# Patient Record
Sex: Male | Born: 1970 | Race: Black or African American | Hispanic: No | Marital: Single | Smoking: Current every day smoker
Health system: Southern US, Community
[De-identification: ages and names within clinical notes are randomized; demographics above are authoritative.]

## PROBLEM LIST (undated history)

## (undated) DIAGNOSIS — Z9989 Dependence on other enabling machines and devices: Secondary | ICD-10-CM

## (undated) DIAGNOSIS — G4733 Obstructive sleep apnea (adult) (pediatric): Secondary | ICD-10-CM

## (undated) DIAGNOSIS — I10 Essential (primary) hypertension: Secondary | ICD-10-CM

## (undated) HISTORY — PX: OTHER SURGICAL HISTORY: SHX169

## (undated) HISTORY — DX: Dependence on other enabling machines and devices: Z99.89

## (undated) HISTORY — DX: Obstructive sleep apnea (adult) (pediatric): G47.33

## (undated) HISTORY — DX: Essential (primary) hypertension: I10

## (undated) HISTORY — PX: TESTICLE REMOVAL: SHX68

---

## 1998-11-18 ENCOUNTER — Encounter: Admission: RE | Admit: 1998-11-18 | Discharge: 1999-02-16 | Payer: Self-pay | Admitting: Family Medicine

## 2000-02-16 ENCOUNTER — Emergency Department (HOSPITAL_COMMUNITY): Admission: EM | Admit: 2000-02-16 | Discharge: 2000-02-16 | Payer: Self-pay | Admitting: Emergency Medicine

## 2000-02-16 ENCOUNTER — Encounter: Payer: Self-pay | Admitting: Emergency Medicine

## 2004-06-27 ENCOUNTER — Emergency Department (HOSPITAL_COMMUNITY): Admission: EM | Admit: 2004-06-27 | Discharge: 2004-06-27 | Payer: Self-pay | Admitting: Emergency Medicine

## 2006-02-15 ENCOUNTER — Ambulatory Visit (HOSPITAL_COMMUNITY): Admission: RE | Admit: 2006-02-15 | Discharge: 2006-02-15 | Payer: Self-pay | Admitting: Emergency Medicine

## 2006-02-15 ENCOUNTER — Emergency Department (HOSPITAL_COMMUNITY): Admission: EM | Admit: 2006-02-15 | Discharge: 2006-02-15 | Payer: Self-pay | Admitting: Emergency Medicine

## 2006-02-15 ENCOUNTER — Encounter: Payer: Self-pay | Admitting: Vascular Surgery

## 2009-10-20 ENCOUNTER — Emergency Department (HOSPITAL_COMMUNITY): Admission: EM | Admit: 2009-10-20 | Discharge: 2009-10-21 | Payer: Self-pay | Admitting: Emergency Medicine

## 2009-10-21 ENCOUNTER — Ambulatory Visit: Payer: Self-pay | Admitting: Vascular Surgery

## 2009-10-21 ENCOUNTER — Encounter (INDEPENDENT_AMBULATORY_CARE_PROVIDER_SITE_OTHER): Payer: Self-pay | Admitting: Emergency Medicine

## 2009-10-21 ENCOUNTER — Ambulatory Visit: Admission: RE | Admit: 2009-10-21 | Discharge: 2009-10-21 | Payer: Self-pay | Admitting: Emergency Medicine

## 2013-06-19 ENCOUNTER — Ambulatory Visit (HOSPITAL_COMMUNITY)
Admission: RE | Admit: 2013-06-19 | Discharge: 2013-06-19 | Disposition: A | Discharge status: Home / Self Care | Payer: BC Managed Care – PPO | Source: Ambulatory Visit | Attending: Internal Medicine | Admitting: Internal Medicine

## 2013-06-19 ENCOUNTER — Other Ambulatory Visit (HOSPITAL_COMMUNITY): Payer: Self-pay | Admitting: Internal Medicine

## 2013-06-19 DIAGNOSIS — R0989 Other specified symptoms and signs involving the circulatory and respiratory systems: Secondary | ICD-10-CM | POA: Insufficient documentation

## 2013-06-19 DIAGNOSIS — R0602 Shortness of breath: Secondary | ICD-10-CM

## 2013-06-19 DIAGNOSIS — F17200 Nicotine dependence, unspecified, uncomplicated: Secondary | ICD-10-CM | POA: Insufficient documentation

## 2013-06-19 DIAGNOSIS — R059 Cough, unspecified: Secondary | ICD-10-CM | POA: Insufficient documentation

## 2013-06-19 DIAGNOSIS — R05 Cough: Secondary | ICD-10-CM | POA: Insufficient documentation

## 2014-02-01 ENCOUNTER — Ambulatory Visit (INDEPENDENT_AMBULATORY_CARE_PROVIDER_SITE_OTHER): Payer: BC Managed Care – PPO | Admitting: Internal Medicine

## 2014-02-01 VITALS — BP 136/88 | HR 98 | Temp 99.7°F | Resp 18 | Ht 67.0 in | Wt >= 6400 oz

## 2014-02-01 DIAGNOSIS — N390 Urinary tract infection, site not specified: Secondary | ICD-10-CM

## 2014-02-01 DIAGNOSIS — I10 Essential (primary) hypertension: Secondary | ICD-10-CM

## 2014-02-01 DIAGNOSIS — R8281 Pyuria: Secondary | ICD-10-CM

## 2014-02-01 DIAGNOSIS — R350 Frequency of micturition: Secondary | ICD-10-CM

## 2014-02-01 LAB — POCT UA - MICROSCOPIC ONLY
Bacteria, U Microscopic: NEGATIVE
Casts, Ur, LPF, POC: NEGATIVE
Crystals, Ur, HPF, POC: NEGATIVE
Epithelial cells, urine per micros: NEGATIVE
Mucus, UA: NEGATIVE
Yeast, UA: NEGATIVE

## 2014-02-01 LAB — POCT URINALYSIS DIPSTICK
Bilirubin, UA: NEGATIVE
Glucose, UA: NEGATIVE
Ketones, UA: NEGATIVE
Nitrite, UA: NEGATIVE
Protein, UA: 300
Spec Grav, UA: 1.025
Urobilinogen, UA: 2
pH, UA: 6.5

## 2014-02-01 MED ORDER — PHENAZOPYRIDINE HCL 200 MG PO TABS: 200.0000 mg | ORAL_TABLET | Freq: Three times a day (TID) | ORAL | Status: DC | PRN

## 2014-02-01 MED ORDER — CIPROFLOXACIN HCL 500 MG PO TABS: 500.0000 mg | ORAL_TABLET | Freq: Two times a day (BID) | ORAL | Status: DC

## 2014-02-01 NOTE — Progress Notes (Signed)
Subjective:    Patient ID: Cesar Parker, male    DOB: 11/21/1970, 43 y.o.   MRN: [id number]  Urinary Frequency  Associated symptoms include frequency.  Dysuria  Associated symptoms include frequency.   Chief Complaint  Patient presents with  . Urinary Frequency    x 1 day  . Dysuria   This chart was scribed for Cesar Siaobert Marshae Azam, MD by [name], ED Scribe. This patient was seen in room 12 and the patient's care was started at 1:32 PM.  HPI Comments: Cesar FifeDwayne K Luth is a 43 y.o. male with hx HTN  who presents to the [facility] complaining of possible UTI onset 1 day. Pt reports urinary frequency and dysuria 36 hrs.  Pt reports he has recently rekindled with an old sexual partner and received oral intercourse. Pt denies new sexual partner. 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 He denies penile discharge and testicle pain. Pt denies hx UTI.   Past Medical History  Diagnosis Date  . Hypertension    History reviewed. No pertinent past surgical history. Prior to Admission medications   Medication Sig Start Date End Date Taking? Authorizing Provider  hydrochlorothiazide (MICROZIDE) 12.5 MG capsule Take 12.5 mg by mouth daily.   Yes Historical Provider, MD   Review of Systems  Constitutional: Negative for fever.  Genitourinary: Positive for dysuria and frequency. Negative for discharge and testicular pain.   Objective:   Physical Exam  Nursing note and vitals reviewed. Constitutional: He is oriented to person, place, and time. He appears well-developed and well-nourished. No distress.  HENT:  Head: Normocephalic and atraumatic.  Right Ear: External ear normal.  Left Ear: External ear normal.  Mouth/Throat: Oropharynx is clear and moist.  Eyes: Conjunctivae and EOM are normal. Pupils are equal, round, and reactive to light.  Neck: Normal range of motion. Neck supple.  Cardiovascular: Normal rate, regular  rhythm and normal heart sounds.   Pulmonary/Chest: Effort normal and breath sounds normal.  Musculoskeletal: Normal range of motion.  Neurological: He is alert and oriented to person, place, and time.  Skin: Skin is warm and dry.  Psychiatric: He has a normal mood and affect. His behavior is normal.   Results for orders placed in visit on 02/01/14  POCT URINALYSIS DIPSTICK      Result Value Ref Range   Color, UA yellow     Clarity, UA clear     Glucose, UA neg     Bilirubin, UA neg     Ketones, UA neg     Spec Grav, UA 1.025     Blood, UA large     pH, UA 6.5     Protein, UA 300     Urobilinogen, UA 2.0     Nitrite, UA neg     Leukocytes, UA large (3+)    POCT UA - MICROSCOPIC ONLY      Result Value Ref Range   WBC, Ur, HPF, POC TNTC     RBC, urine, microscopic TNTC     Bacteria, U Microscopic neg     Mucus, UA neg     Epithelial cells, urine per micros neg     Crystals, Ur, HPF, POC neg     Casts, Ur, LPF, POC neg     Yeast, UA neg       Assessment & Plan:  I have completed the patient encounter in its entirety as documented by the  scribe, with editing by me where necessary. Laddie Math P. Merla Richesoolittle, M.D. Urinary frequency - Plan: POCT urinalysis dipstick, POCT UA - Microscopic Only  Pyuria - Plan: Urine culture, GC/Chlamydia Probe Amp   Meds ordered this encounter  Medications  . ciprofloxacin (CIPRO) 500 MG tablet    Sig: Take 1 tablet (500 mg total) by mouth 2 (two) times daily.    Dispense:  20 tablet    Refill:  0  . phenazopyridine (PYRIDIUM) 200 MG tablet    Sig: Take 1 tablet (200 mg total) by mouth 3 (three) times daily as needed for pain.    Dispense:  10 tablet    Refill:  0  Call w/ plan

## 2014-02-04 LAB — GC/CHLAMYDIA PROBE AMP
CT PROBE, AMP APTIMA: NEGATIVE
GC PROBE AMP APTIMA: NEGATIVE

## 2014-02-04 LAB — URINE CULTURE: Colony Count: >=100000

## 2015-02-02 ENCOUNTER — Encounter (HOSPITAL_COMMUNITY): Payer: Self-pay | Admitting: Emergency Medicine

## 2015-02-02 ENCOUNTER — Emergency Department (HOSPITAL_COMMUNITY)
Admission: EM | Admit: 2015-02-02 | Discharge: 2015-02-02 | Disposition: A | Discharge status: Home / Self Care | Payer: Self-pay | Attending: Emergency Medicine | Admitting: Emergency Medicine

## 2015-02-02 DIAGNOSIS — I10 Essential (primary) hypertension: Secondary | ICD-10-CM | POA: Insufficient documentation

## 2015-02-02 DIAGNOSIS — L03116 Cellulitis of left lower limb: Secondary | ICD-10-CM | POA: Insufficient documentation

## 2015-02-02 DIAGNOSIS — Z79899 Other long term (current) drug therapy: Secondary | ICD-10-CM | POA: Insufficient documentation

## 2015-02-02 LAB — I-STAT CHEM 8, ED
BUN: 11 mg/dL (ref 6–20)
CALCIUM ION: 1.17 mmol/L (ref 1.12–1.23)
Chloride: 98 mmol/L — ABNORMAL LOW (ref 101–111)
Creatinine, Ser: 0.9 mg/dL (ref 0.61–1.24)
GLUCOSE: 138 mg/dL — AB (ref 65–99)
HCT: 44 % (ref 39.0–52.0)
Hemoglobin: 15 g/dL (ref 13.0–17.0)
Potassium: 3.4 mmol/L — ABNORMAL LOW (ref 3.5–5.1)
Sodium: 140 mmol/L (ref 135–145)
TCO2: 29 mmol/L (ref 0–100)

## 2015-02-02 MED ORDER — POTASSIUM CHLORIDE CRYS ER 20 MEQ PO TBCR
40.0000 meq | EXTENDED_RELEASE_TABLET | Freq: Once | ORAL | Status: AC
  Administered 2015-02-02: 40 meq via ORAL
  Filled 2015-02-02: qty 2

## 2015-02-02 MED ORDER — ACETAMINOPHEN 500 MG PO TABS
1000.0000 mg | ORAL_TABLET | Freq: Once | ORAL | Status: AC
  Administered 2015-02-02: 1000 mg via ORAL
  Filled 2015-02-02: qty 2

## 2015-02-02 MED ORDER — CEPHALEXIN 500 MG PO CAPS: 500.0000 mg | ORAL_CAPSULE | Freq: Four times a day (QID) | ORAL | Status: DC

## 2015-02-02 MED ORDER — CEPHALEXIN 500 MG PO CAPS
500.0000 mg | ORAL_CAPSULE | Freq: Once | ORAL | Status: AC
Start: 2015-02-02 — End: 2015-02-02
  Administered 2015-02-02: 500 mg via ORAL
  Filled 2015-02-02: qty 1

## 2015-02-02 NOTE — Progress Notes (Signed)
CM spoke with pt who confirms uninsured [place] resident with no pcp.  CM discussed and provided written information for uninsured accepting pcps, discussed the importance of pcp vs EDP services for f/u care, www.needymeds.org, www.goodrx.com, discounted pharmacies and other [place] resources such as [facility] , [facility], affordable care act, financial assistance, uninsured dental services, [place] [facility], DSS and  health department  Reviewed resources for [place] uninsured accepting pcps like [facility], [facility] [address], [facility], [facility], local urgent care centers, [facility], [facility], [facility], [facility], [facility] plus others, medication resources, [facility] out patient pharmacies and housing Pt voiced understanding and appreciation of resources provided   Provided [facility] contact information Pt agreed to a referral Cm completed referral Pt to be contact by [facility] clinical liason  

## 2015-02-02 NOTE — ED Provider Notes (Signed)
CSN: [id number]     Arrival date & time 02/02/15  1143 History   First MD Initiated Contact with Patient 02/02/15 1259     Chief Complaint  Patient presents with  . Leg Pain     (Consider location/radiation/quality/duration/timing/severity/associated sxs/prior Treatment) HPI Complains of pain at left lower extremity onset 2 nights ago here patient reports 2 nights ago he had chills and subjective fever which have since resolved. He treated himself with ibuprofen this morning. With partial relief. No other associated symptoms. No nausea or vomiting. Pain is worse with weightbearing improved with elevating his leg. Past Medical History  Diagnosis Date  . Hypertension    History reviewed. No pertinent past surgical history. No family history on file. Social History  Substance Use Topics  . Smoking status: Never Smoker   . Smokeless tobacco: None  . Alcohol Use: None    Review of Systems  Musculoskeletal: Positive for myalgias.       Left leg and foot pain      Allergies  Review of patient's allergies indicates no known allergies.  Home Medications   Prior to Admission medications   Medication Sig Start Date End Date Taking? Authorizing Provider  amLODipine (NORVASC) 10 MG tablet Take 10 mg by mouth daily.   Yes Historical Provider, MD  hydrochlorothiazide (MICROZIDE) 12.5 MG capsule Take 12.5 mg by mouth daily.   Yes Historical Provider, MD  ibuprofen (ADVIL,MOTRIN) 200 MG tablet Take 400 mg by mouth every 6 (six) hours as needed for moderate pain.   Yes Historical Provider, MD  lisinopril (PRINIVIL,ZESTRIL) 10 MG tablet Take 10 mg by mouth daily.   Yes Historical Provider, MD  Multiple Vitamin (MULTIVITAMIN WITH MINERALS) TABS tablet Take 1 tablet by mouth daily.   Yes Historical Provider, MD  ciprofloxacin (CIPRO) 500 MG tablet Take 1 tablet (500 mg total) by mouth 2 (two) times daily. Patient not taking: Reported on 02/02/2015 02/01/14   Tonye Pearson, MD   phenazopyridine (PYRIDIUM) 200 MG tablet Take 1 tablet (200 mg total) by mouth 3 (three) times daily as needed for pain. Patient not taking: Reported on 02/02/2015 02/01/14   Tonye Pearson, MD   BP 142/82 mmHg  Pulse 74  Temp(Src) 97.8 F (36.6 C) (Oral)  Resp 18  SpO2 100% Physical Exam  Constitutional: He appears well-developed and well-nourished.  HENT:  Head: Normocephalic and atraumatic.  Eyes: Conjunctivae are normal. Pupils are equal, round, and reactive to light.  Neck: Neck supple. No tracheal deviation present. No thyromegaly present.  Cardiovascular: Normal rate and regular rhythm.   No murmur heard. Pulmonary/Chest: Effort normal and breath sounds normal.  Abdominal: Soft. Bowel sounds are normal. He exhibits no distension. There is no tenderness.  Morbidly obese  Musculoskeletal: Normal range of motion. He exhibits no edema or tenderness.  Left lower extremity reddened mildly swollen tender at proximal aspect of dorsal foot and distal one third of shin and anterolateral lower leg. No red streaks of leg note. No pain or tenderness posteriorly. No inguinal nodes.  Neurological: He is alert. Coordination normal.  Skin: Skin is warm and dry. No rash noted.  Psychiatric: He has a normal mood and affect.  Nursing note and vitals reviewed.   ED Course  Procedures (including critical care time) Labs Review Labs Reviewed - No data to display  Imaging Review No results found. I have personally reviewed and evaluated these images and lab results as part of my medical decision-making.   EKG Interpretation None  3:25 PM pain improved after treatment with Tylenol patient feels ready to go home. Results for orders placed or performed during the hospital encounter of 02/02/15  I-stat chem 8, ed  Result Value Ref Range   Sodium 140 135 - 145 mmol/L   Potassium 3.4 (L) 3.5 - 5.1 mmol/L   Chloride 98 (L) 101 - 111 mmol/L   BUN 11 6 - 20 mg/dL   Creatinine, Ser  8.29 0.61 - 1.24 mg/dL   Glucose, Bld 562 (H) 65 - 99 mg/dL   Calcium, Ion 1.30 8.65 - 1.23 mmol/L   TCO2 29 0 - 100 mmol/L   Hemoglobin 15.0 13.0 - 17.0 g/dL   HCT 78.4 69.6 - 29.5 %   No results found.  MDM  Exam and history consistent with cellulitis. Distribution of redness is not consistent with phlebitis  Plan prescription Keflex. Follow-up with Dr.Haasann within one week for reexam. Suggest hemoglobin A1c. Final diagnoses:  None   diagnosis #1 cellulitis of left lower extremity #2 hyperglycemia #3 hypokalemia      Doug Sou, MD 02/02/15 [phone]

## 2015-02-02 NOTE — Discharge Instructions (Signed)
Cellulitis Take Tylenol as directed for pain. Call Dr.Hassan's office tomorrow to arrange to get your leg recheck within 1 week. Your blood sugar today was elevated at 138. You may be diabetic. Ask Dr.Hassan to check you for diabetes with a test known as hemoglobin A1c. Return for fever, vomiting, or if you feel worse for any reason Cellulitis is an infection of the skin and the tissue under the skin. The infected area is usually red and tender. This happens most often in the arms and lower legs. HOME CARE   Take your antibiotic medicine as told. Finish the medicine even if you start to feel better.  Keep the infected arm or leg raised (elevated).  Put a warm cloth on the area up to 4 times per day.  Only take medicines as told by your doctor.  Keep all doctor visits as told. GET HELP IF:  You see red streaks on the skin coming from the infected area.  Your red area gets bigger or turns a dark color.  Your bone or joint under the infected area is painful after the skin heals.  Your infection comes back in the same area or different area.  You have a puffy (swollen) bump in the infected area.  You have new symptoms.  You have a fever. GET HELP RIGHT AWAY IF:   You feel very sleepy.  You throw up (vomit) or have watery poop (diarrhea).  You feel sick and have muscle aches and pains.   This information is not intended to replace advice given to you by your health care provider. Make sure you discuss any questions you have with your health care provider.   Document Released: 09/28/2007 Document Revised: 12/31/2014 Document Reviewed: 06/27/2011 Elsevier Interactive Patient Education [employer]

## 2015-02-02 NOTE — ED Notes (Signed)
Per pt, state left lower leg pain without injury which started on Saturday-swollen and warm to touch

## 2015-03-20 ENCOUNTER — Ambulatory Visit (INDEPENDENT_AMBULATORY_CARE_PROVIDER_SITE_OTHER): Payer: 59 | Admitting: Family Medicine

## 2015-03-20 ENCOUNTER — Encounter: Payer: Self-pay | Admitting: Family Medicine

## 2015-03-20 VITALS — BP 128/86 | HR 81 | Temp 98.1°F | Resp 16 | Ht 67.0 in | Wt >= 6400 oz

## 2015-03-20 DIAGNOSIS — Z131 Encounter for screening for diabetes mellitus: Secondary | ICD-10-CM

## 2015-03-20 DIAGNOSIS — F524 Premature ejaculation: Secondary | ICD-10-CM

## 2015-03-20 DIAGNOSIS — Z13220 Encounter for screening for lipoid disorders: Secondary | ICD-10-CM | POA: Diagnosis not present

## 2015-03-20 DIAGNOSIS — G4733 Obstructive sleep apnea (adult) (pediatric): Secondary | ICD-10-CM | POA: Diagnosis not present

## 2015-03-20 DIAGNOSIS — E049 Nontoxic goiter, unspecified: Secondary | ICD-10-CM | POA: Diagnosis not present

## 2015-03-20 DIAGNOSIS — D6489 Other specified anemias: Secondary | ICD-10-CM | POA: Diagnosis not present

## 2015-03-20 DIAGNOSIS — I10 Essential (primary) hypertension: Secondary | ICD-10-CM | POA: Diagnosis not present

## 2015-03-20 DIAGNOSIS — Z9989 Dependence on other enabling machines and devices: Secondary | ICD-10-CM

## 2015-03-20 DIAGNOSIS — E010 Iodine-deficiency related diffuse (endemic) goiter: Secondary | ICD-10-CM

## 2015-03-20 LAB — POCT URINALYSIS DIP (MANUAL ENTRY)
BILIRUBIN UA: NEGATIVE
GLUCOSE UA: NEGATIVE
Ketones, POC UA: NEGATIVE
LEUKOCYTES UA: NEGATIVE
NITRITE UA: NEGATIVE
PH UA: 5.5
Protein Ur, POC: NEGATIVE
RBC UA: NEGATIVE
Spec Grav, UA: 1.02
UROBILINOGEN UA: 0.2

## 2015-03-20 MED ORDER — HYDROCHLOROTHIAZIDE 12.5 MG PO TABS: 12.5000 mg | ORAL_TABLET | Freq: Every day | ORAL | Status: DC

## 2015-03-20 MED ORDER — LISINOPRIL 10 MG PO TABS: 10.0000 mg | ORAL_TABLET | Freq: Every day | ORAL | Status: DC

## 2015-03-20 MED ORDER — AMLODIPINE BESYLATE 10 MG PO TABS: 10.0000 mg | ORAL_TABLET | Freq: Every day | ORAL | Status: DC

## 2015-03-20 MED ORDER — CITALOPRAM HYDROBROMIDE 20 MG PO TABS: 20.0000 mg | ORAL_TABLET | Freq: Every day | ORAL | Status: DC

## 2015-03-20 NOTE — Progress Notes (Signed)
Subjective:    Patient ID: Cesar Parker, male    DOB: 18-Nov-1970, 44 y.o.   MRN: [id number]  03/20/2015  establish care; needs form filled out; and low sperm count   HPI This 44 y.o. male presents to establish care.  Last physical: 2015 Dr. Roseanne RenoHassan; 05-28-13. TDAP:  UTD; less than ten years ago. Influenza:  refuses Eye exam:  No formal eye exam; +recent employment; no glasses or contacts Dental exam:  Not established anywhere; needs tooth extraction.  Must go to oral surgeon.  HTN: Patient reports good compliance with medication, good tolerance to medication, and good symptom control.  Dizziness does occur intermittently; can occur sporadically; occurs once per month; duration 2 minutes.  Does not check BP.  Was followed by Dr. Roseanne RenoHassan once per year.  OSA with CPAP: diagnosed last year.  Wears CPAP every night.  Wearing atleast 4 hours per night.    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  Girlfriend did get pregnant once and miscarried; s/p fertility evaluation; low sperm count; started on medication that will help increase sperm count; Clomid.  Ran out of Clomid.  Requesting more Clomid.  +premature ejaculation; chronic issue for patient.     Review of Systems  Constitutional: Negative for fever, chills, diaphoresis, activity change, appetite change and fatigue.  Eyes: Negative for visual disturbance.  Respiratory: Negative for cough and shortness of breath.   Cardiovascular: Negative for chest pain, palpitations and leg swelling.  Endocrine: Negative for cold intolerance, heat intolerance, polydipsia, polyphagia and polyuria.  Neurological: Negative for dizziness, tremors, seizures, syncope, facial asymmetry, speech difficulty, weakness, light-headedness, numbness and headaches.  Psychiatric/Behavioral: Negative for suicidal ideas, sleep disturbance, self-injury and dysphoric mood. The patient is not nervous/anxious.     Past Medical History  Diagnosis Date  . Hypertension      onset 2012  . OSA on CPAP    Past Surgical History  Procedure Laterality Date  . Calcaneal spur      Resection. B.  . Testicle removal Right    No Known Allergies  Social History   Social History  . Marital Status: Single    Spouse Name: N/A  . Number of Children: N/A  . Years of Education: N/A   Occupational History  . Not on file.   Social History Main Topics  . Smoking status: Current Every Day Smoker  . Smokeless tobacco: Not on file  . Alcohol Use: Not on file  . Drug Use: Not on file  . Sexual Activity: Yes   Other Topics Concern  . Not on file   Social History Narrative   Marital status: single; dating seriously x 6 years      Children: none; does foster children intermittently      Lives: with girlfriend      Employment: truck driver x 1 years; drives [place]     Tobacco: 1/2 ppd x 20 years      Alcohol:  Socially; rare; last drink 2014      Drugs: none      Exercise: work is physical   Family History  Problem Relation Age of Onset  . Drug abuse Sister   . Mental illness Sister        Objective:    BP 128/86 mmHg  Pulse 81  Temp(Src) 98.1 F (36.7 C) (Oral)  Resp 16  Ht 5\' 7"  (1.702 m)  Wt 410 lb (185.975 kg)  BMI 64.20 kg/m2 Physical Exam  Constitutional: He is oriented  to person, place, and time. He appears well-developed and well-nourished. No distress.  Morbidly obese.  HENT:  Head: Normocephalic and atraumatic.  Right Ear: External ear normal.  Left Ear: External ear normal.  Nose: Nose normal.  Mouth/Throat: Oropharynx is clear and moist.  Eyes: Conjunctivae and EOM are normal. Pupils are equal, round, and reactive to light.  Neck: Normal range of motion. Neck supple. Carotid bruit is not present. Thyromegaly present.  Cardiovascular: Normal rate, regular rhythm, normal heart sounds and intact distal pulses.  Exam reveals no gallop and no friction rub.   No murmur heard. Pulmonary/Chest: Effort normal and breath sounds  normal. He has no wheezes. He has no rales.  Abdominal: Soft. Bowel sounds are normal. He exhibits no distension and no mass. There is no tenderness. There is no rebound and no guarding.  Lymphadenopathy:    He has no cervical adenopathy.  Neurological: He is alert and oriented to person, place, and time. No cranial nerve deficit.  Skin: Skin is warm and dry. No rash noted. He is not diaphoretic.  Psychiatric: He has a normal mood and affect. His behavior is normal.  Nursing note and vitals reviewed.       Assessment & Plan:   1. Essential hypertension   2. Morbid obesity due to excess calories (HCC)   3. Goiter   4. Screening for diabetes mellitus   5. Screening, lipid   6. OSA on CPAP   7. Premature ejaculation   8. Thyromegaly   9. Anemia due to other cause     1. HTN: controlled; obtain labs; refills provided. 2. Morbid obesity: recommend weight loss, exercise, low-caloric food choices.  RTC 3 months. 3.  Goiter: New.  Refer for thyroid US; obtain labs. 4.  Screening diabetes: obtain glucose, HgbA1c. 5. Screening lipid: obtain FLP. 6.  OSA on CPAP: stable; compliance with nightly CPAP. 7.  Premature ejaculation: chronic/uncontrolled; rx for Citalopram  qhs provided. 8.  Anemia: New; obtain labs.  Repeat at next visit.  Will need to complete stool cards at next visit.    Orders Placed This Encounter  Procedures  . US Soft Tissue Head/Neck    409 lbs/no needs/Ins uhc/aw & pt w/epic order    Standing Status: Future     Number of Occurrences: 1     Standing Expiration Date: 05/19/2016    Order Specific Question:  Reason for Exam (SYMPTOM  OR DIAGNOSIS REQUIRED)    Answer:  B thyroid enlargement/goiter    Order Specific Question:  Preferred imaging location?    Answer:  [facility]  . CBC with Differential/Platelet  . Comprehensive metabolic panel    Order Specific Question:  Has the patient fasted?    Answer:  Yes  . Lipid panel    Order Specific  Question:  Has the patient fasted?    Answer:  Yes  . TSH  . Hemoglobin A1c  . T4, free  . CBC with Differential/Platelet    Standing Status: Future     Number of Occurrences:      Standing Expiration Date: 04/13/2016  . Iron    Standing Status: Future     Number of Occurrences:      Standing Expiration Date: 04/13/2016  . IBC panel    Standing Status: Future     Number of Occurrences:      Standing Expiration Date: 04/13/2016  . Vitamin B12    Standing Status: Future     Number of Occurrences:  Standing Expiration Date: 04/13/2016  . POCT urinalysis dipstick  . EKG 12-Lead   Meds ordered this encounter  Medications  . lisinopril (PRINIVIL,ZESTRIL) 10 MG tablet    Sig: Take 1 tablet (10 mg total) by mouth daily.    Dispense:  90 tablet    Refill:  3  . amLODipine (NORVASC) 10 MG tablet    Sig: Take 1 tablet (10 mg total) by mouth daily.    Dispense:  90 tablet    Refill:  3  . hydrochlorothiazide (HYDRODIURIL) 12.5 MG tablet    Sig: Take 1 tablet (12.5 mg total) by mouth daily.    Dispense:  90 tablet    Refill:  1  . citalopram (CELEXA) 20 MG tablet    Sig: Take 1 tablet (20 mg total) by mouth daily.    Dispense:  90 tablet    Refill:  1    Return in about 6 months (around 09/17/2015) for complete physical examiniation.    Aahan Marques Paulita Fujita, M.D. [facility] [address] [place], [place]  [address] [phone] phone [phone] fax

## 2015-03-20 NOTE — Patient Instructions (Signed)
DASH Eating Plan  DASH stands for "Dietary Approaches to Stop Hypertension." The DASH eating plan is a healthy eating plan that has been shown to reduce high blood pressure (hypertension). Additional health benefits may include reducing the risk of type 2 diabetes mellitus, heart disease, and stroke. The DASH eating plan may also help with weight loss.  WHAT DO I NEED TO KNOW ABOUT THE DASH EATING PLAN?  For the DASH eating plan, you will follow these general guidelines:  · Choose foods with a percent daily value for sodium of less than 5% (as listed on the food label).  · Use salt-free seasonings or herbs instead of table salt or sea salt.  · Check with your health care provider or pharmacist before using salt substitutes.  · Eat lower-sodium products, often labeled as "lower sodium" or "no salt added."  · Eat fresh foods.  · Eat more vegetables, fruits, and low-fat dairy products.  · Choose whole grains. Look for the word "whole" as the first word in the ingredient list.  · Choose fish and skinless chicken or turkey more often than red meat. Limit fish, poultry, and meat to 6 oz (170 g) each day.  · Limit sweets, desserts, sugars, and sugary drinks.  · Choose heart-healthy fats.  · Limit cheese to 1 oz (28 g) per day.  · Eat more home-cooked food and less restaurant, buffet, and fast food.  · Limit fried foods.  · Cook foods using methods other than frying.  · Limit canned vegetables. If you do use them, rinse them well to decrease the sodium.  · When eating at a restaurant, ask that your food be prepared with less salt, or no salt if possible.  WHAT FOODS CAN I EAT?  Seek help from a dietitian for individual calorie needs.  Grains  Whole grain or whole wheat bread. Brown rice. Whole grain or whole wheat pasta. Quinoa, bulgur, and whole grain cereals. Low-sodium cereals. Corn or whole wheat flour tortillas. Whole grain cornbread. Whole grain crackers. Low-sodium crackers.  Vegetables  Fresh or frozen vegetables  (raw, steamed, roasted, or grilled). Low-sodium or reduced-sodium tomato and vegetable juices. Low-sodium or reduced-sodium tomato sauce and paste. Low-sodium or reduced-sodium canned vegetables.   Fruits  All fresh, canned (in natural juice), or frozen fruits.  Meat and Other Protein Products  Ground beef (85% or leaner), grass-fed beef, or beef trimmed of fat. Skinless chicken or turkey. Ground chicken or turkey. Pork trimmed of fat. All fish and seafood. Eggs. Dried beans, peas, or lentils. Unsalted nuts and seeds. Unsalted canned beans.  Dairy  Low-fat dairy products, such as skim or 1% milk, 2% or reduced-fat cheeses, low-fat ricotta or cottage cheese, or plain low-fat yogurt. Low-sodium or reduced-sodium cheeses.  Fats and Oils  Tub margarines without trans fats. Light or reduced-fat mayonnaise and salad dressings (reduced sodium). Avocado. Safflower, olive, or canola oils. Natural peanut or almond butter.  Other  Unsalted popcorn and pretzels.  The items listed above may not be a complete list of recommended foods or beverages. Contact your dietitian for more options.  WHAT FOODS ARE NOT RECOMMENDED?  Grains  White bread. White pasta. White rice. Refined cornbread. Bagels and croissants. Crackers that contain trans fat.  Vegetables  Creamed or fried vegetables. Vegetables in a cheese sauce. Regular canned vegetables. Regular canned tomato sauce and paste. Regular tomato and vegetable juices.  Fruits  Dried fruits. Canned fruit in light or heavy syrup. Fruit juice.  Meat and Other Protein   Products  Fatty cuts of meat. Ribs, chicken wings, bacon, sausage, bologna, salami, chitterlings, fatback, hot dogs, bratwurst, and packaged luncheon meats. Salted nuts and seeds. Canned beans with salt.  Dairy  Whole or 2% milk, cream, half-and-half, and cream cheese. Whole-fat or sweetened yogurt. Full-fat cheeses or blue cheese. Nondairy creamers and whipped toppings. Processed cheese, cheese spreads, or cheese  curds.  Condiments  Onion and garlic salt, seasoned salt, table salt, and sea salt. Canned and packaged gravies. Worcestershire sauce. Tartar sauce. Barbecue sauce. Teriyaki sauce. Soy sauce, including reduced sodium. Steak sauce. Fish sauce. Oyster sauce. Cocktail sauce. Horseradish. Ketchup and mustard. Meat flavorings and tenderizers. Bouillon cubes. Hot sauce. Tabasco sauce. Marinades. Taco seasonings. Relishes.  Fats and Oils  Butter, stick margarine, lard, shortening, ghee, and bacon fat. Coconut, palm kernel, or palm oils. Regular salad dressings.  Other  Pickles and olives. Salted popcorn and pretzels.  The items listed above may not be a complete list of foods and beverages to avoid. Contact your dietitian for more information.  WHERE CAN I FIND MORE INFORMATION?  National Heart, Lung, and [employer] Institute: www.nhlbi.nih.gov/health/health-topics/topics/dash/     This information is not intended to replace advice given to you by your health care provider. Make sure you discuss any questions you have with your health care provider.     Document Released: 03/31/2011 Document Revised: 05/02/2014 Document Reviewed: 02/13/2013  Elsevier Interactive Patient Education ©2016 [employer]

## 2015-03-21 LAB — CBC WITH DIFFERENTIAL/PLATELET
BASOS ABS: 0 10*3/uL (ref 0.0–0.1)
BASOS PCT: 0 % (ref 0–1)
EOS ABS: 0.2 10*3/uL (ref 0.0–0.7)
EOS PCT: 2 % (ref 0–5)
HCT: 38.7 % — ABNORMAL LOW (ref 39.0–52.0)
Hemoglobin: 12.2 g/dL — ABNORMAL LOW (ref 13.0–17.0)
Lymphocytes Relative: 23 % (ref 12–46)
Lymphs Abs: 2 10*3/uL (ref 0.7–4.0)
MCH: 25.8 pg — ABNORMAL LOW (ref 26.0–34.0)
MCHC: 31.5 g/dL (ref 30.0–36.0)
MCV: 81.8 fL (ref 78.0–100.0)
MONO ABS: 0.6 10*3/uL (ref 0.1–1.0)
MPV: 10.8 fL (ref 8.6–12.4)
Monocytes Relative: 7 % (ref 3–12)
Neutro Abs: 6.1 10*3/uL (ref 1.7–7.7)
Neutrophils Relative %: 68 % (ref 43–77)
PLATELETS: 249 10*3/uL (ref 150–400)
RBC: 4.73 MIL/uL (ref 4.22–5.81)
RDW: 14.4 % (ref 11.5–15.5)
WBC: 8.9 10*3/uL (ref 4.0–10.5)

## 2015-03-21 LAB — COMPREHENSIVE METABOLIC PANEL
ALK PHOS: 73 U/L (ref 40–115)
ALT: 19 U/L (ref 9–46)
AST: 19 U/L (ref 10–40)
Albumin: 3.6 g/dL (ref 3.6–5.1)
BILIRUBIN TOTAL: 0.5 mg/dL (ref 0.2–1.2)
BUN: 12 mg/dL (ref 7–25)
CO2: 29 mmol/L (ref 20–31)
Calcium: 8.9 mg/dL (ref 8.6–10.3)
Chloride: 100 mmol/L (ref 98–110)
Creat: 0.82 mg/dL (ref 0.60–1.35)
GLUCOSE: 84 mg/dL (ref 65–99)
POTASSIUM: 3.9 mmol/L (ref 3.5–5.3)
Sodium: 138 mmol/L (ref 135–146)
Total Protein: 7.4 g/dL (ref 6.1–8.1)

## 2015-03-21 LAB — TSH: TSH: 1.182 u[IU]/mL (ref 0.350–4.500)

## 2015-03-21 LAB — LIPID PANEL
CHOL/HDL RATIO: 3.2 ratio (ref <=5.0)
Cholesterol: 174 mg/dL (ref 125–200)
HDL: 55 mg/dL (ref >=40)
LDL Cholesterol: 101 mg/dL (ref <130)
Triglycerides: 92 mg/dL (ref <150)
VLDL: 18 mg/dL (ref <30)

## 2015-03-21 LAB — T4, FREE: Free T4: 1.04 ng/dL (ref 0.80–1.80)

## 2015-03-21 LAB — HEMOGLOBIN A1C
HEMOGLOBIN A1C: 4.9 % (ref <5.7)
Mean Plasma Glucose: 94 mg/dL (ref <117)

## 2015-04-14 ENCOUNTER — Encounter: Payer: Self-pay | Admitting: Family Medicine

## 2015-04-15 ENCOUNTER — Telehealth: Payer: Self-pay

## 2015-04-15 NOTE — Telephone Encounter (Signed)
Pt was given his lab results today but still has more questions and would like a call back

## 2015-04-16 NOTE — Telephone Encounter (Signed)
Spoke with pt and answered questions about his labs.

## 2015-04-17 ENCOUNTER — Ambulatory Visit
Admission: RE | Admit: 2015-04-17 | Discharge: 2015-04-17 | Disposition: A | Discharge status: Home / Self Care | Payer: 59 | Source: Ambulatory Visit | Attending: Family Medicine | Admitting: Family Medicine

## 2015-04-17 ENCOUNTER — Other Ambulatory Visit: Payer: Self-pay

## 2015-04-17 DIAGNOSIS — E049 Nontoxic goiter, unspecified: Secondary | ICD-10-CM

## 2015-05-05 ENCOUNTER — Other Ambulatory Visit: Payer: Self-pay | Admitting: Family Medicine

## 2015-05-05 DIAGNOSIS — E042 Nontoxic multinodular goiter: Secondary | ICD-10-CM

## 2015-05-08 ENCOUNTER — Ambulatory Visit: Payer: 59 | Admitting: Family Medicine

## 2015-05-29 ENCOUNTER — Ambulatory Visit: Payer: Self-pay | Admitting: Family Medicine

## 2015-06-02 ENCOUNTER — Ambulatory Visit: Payer: Self-pay | Admitting: Family Medicine

## 2015-09-25 ENCOUNTER — Encounter: Payer: 59 | Admitting: Family Medicine

## 2015-10-20 ENCOUNTER — Encounter: Payer: Self-pay | Admitting: Family Medicine

## 2015-11-16 ENCOUNTER — Telehealth: Payer: Self-pay

## 2015-11-16 DIAGNOSIS — I10 Essential (primary) hypertension: Secondary | ICD-10-CM

## 2015-11-16 NOTE — Telephone Encounter (Signed)
Patient stated his pharmacy sent over a refill request on Friday and no response. Patient need a refill of Celexa 20 MG and Hydroiudril 12.5 MG. [facility] [place] [phone].

## 2015-11-17 NOTE — Telephone Encounter (Signed)
Spoke with pt, he is planning to come in 2 weeks to see Dr. 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 Can we send in refills for pt?

## 2015-11-17 NOTE — Telephone Encounter (Signed)
Called and LM to call back with details for follow up?

## 2015-11-18 MED ORDER — CITALOPRAM HYDROBROMIDE 20 MG PO TABS: 20.0000 mg | ORAL_TABLET | Freq: Every day | ORAL | 0 refills | Status: DC

## 2015-11-18 NOTE — Telephone Encounter (Signed)
I have approved a 30 days supply of Citalopram for patient.  Please call and advise of this 30 day supply. Please advise patient how to schedule a same day appointment with me.  A Saturday appointment is likely his best option.  Please let him know of my next Saturday schedule.

## 2015-11-19 MED ORDER — HYDROCHLOROTHIAZIDE 12.5 MG PO TABS: 12.5000 mg | ORAL_TABLET | Freq: Every day | ORAL | 0 refills | Status: DC

## 2015-11-19 NOTE — Telephone Encounter (Signed)
Called pt and gave him Dr Michaelle Copas message and sch. He also asked for RF of HCTZ. Done.

## 2015-12-05 ENCOUNTER — Ambulatory Visit (INDEPENDENT_AMBULATORY_CARE_PROVIDER_SITE_OTHER): Payer: 59 | Admitting: Family Medicine

## 2015-12-05 VITALS — BP 112/71 | Temp 98.5°F | Resp 20 | Ht 67.0 in | Wt >= 6400 oz

## 2015-12-05 DIAGNOSIS — E042 Nontoxic multinodular goiter: Secondary | ICD-10-CM

## 2015-12-05 DIAGNOSIS — Z114 Encounter for screening for human immunodeficiency virus [HIV]: Secondary | ICD-10-CM

## 2015-12-05 DIAGNOSIS — D649 Anemia, unspecified: Secondary | ICD-10-CM | POA: Diagnosis not present

## 2015-12-05 DIAGNOSIS — R868 Other abnormal findings in specimens from male genital organs: Secondary | ICD-10-CM

## 2015-12-05 DIAGNOSIS — E041 Nontoxic single thyroid nodule: Secondary | ICD-10-CM | POA: Diagnosis not present

## 2015-12-05 DIAGNOSIS — I10 Essential (primary) hypertension: Secondary | ICD-10-CM | POA: Diagnosis not present

## 2015-12-05 DIAGNOSIS — S39012A Strain of muscle, fascia and tendon of lower back, initial encounter: Secondary | ICD-10-CM

## 2015-12-05 DIAGNOSIS — F524 Premature ejaculation: Secondary | ICD-10-CM

## 2015-12-05 DIAGNOSIS — N4601 Organic azoospermia: Secondary | ICD-10-CM

## 2015-12-05 DIAGNOSIS — G4733 Obstructive sleep apnea (adult) (pediatric): Secondary | ICD-10-CM

## 2015-12-05 DIAGNOSIS — Z9989 Dependence on other enabling machines and devices: Secondary | ICD-10-CM

## 2015-12-05 LAB — POCT CBC
GRANULOCYTE PERCENT: 73.3 % (ref 37–80)
HEMATOCRIT: 37.2 % — AB (ref 43.5–53.7)
Hemoglobin: 12.3 g/dL — AB (ref 14.1–18.1)
Lymph, poc: 2 (ref 0.6–3.4)
MCH, POC: 26.9 pg — AB (ref 27–31.2)
MCHC: 33.1 g/dL (ref 31.8–35.4)
MCV: 81.4 fL (ref 80–97)
MID (cbc): 0.3 (ref 0–0.9)
MPV: 7.2 fL (ref 0–99.8)
PLATELET COUNT, POC: 203 10*3/uL (ref 142–424)
POC GRANULOCYTE: 6.5 (ref 2–6.9)
POC LYMPH PERCENT: 23.1 %L (ref 10–50)
POC MID %: 3.6 %M (ref 0–12)
RBC: 4.57 M/uL — AB (ref 4.69–6.13)
RDW, POC: 14.6 %
WBC: 8.8 10*3/uL (ref 4.6–10.2)

## 2015-12-05 LAB — POCT URINALYSIS DIP (MANUAL ENTRY)
BILIRUBIN UA: NEGATIVE
BILIRUBIN UA: NEGATIVE
GLUCOSE UA: NEGATIVE
LEUKOCYTES UA: NEGATIVE
NITRITE UA: NEGATIVE
Protein Ur, POC: NEGATIVE
Spec Grav, UA: 1.02
Urobilinogen, UA: 0.2
pH, UA: 5.5

## 2015-12-05 LAB — COMPREHENSIVE METABOLIC PANEL
ALBUMIN: 3.5 g/dL — AB (ref 3.6–5.1)
ALT: 16 U/L (ref 9–46)
AST: 15 U/L (ref 10–40)
Alkaline Phosphatase: 83 U/L (ref 40–115)
BUN: 17 mg/dL (ref 7–25)
CHLORIDE: 100 mmol/L (ref 98–110)
CO2: 28 mmol/L (ref 20–31)
Calcium: 9 mg/dL (ref 8.6–10.3)
Creat: 1.19 mg/dL (ref 0.60–1.35)
Glucose, Bld: 79 mg/dL (ref 65–99)
POTASSIUM: 4.1 mmol/L (ref 3.5–5.3)
Sodium: 136 mmol/L (ref 135–146)
TOTAL PROTEIN: 7.2 g/dL (ref 6.1–8.1)
Total Bilirubin: 0.4 mg/dL (ref 0.2–1.2)

## 2015-12-05 LAB — IBC PANEL
%SAT: 32 % (ref 15–60)
TIBC: 256 ug/dL (ref 250–425)
UIBC: 173 ug/dL (ref 125–400)

## 2015-12-05 LAB — FERRITIN: Ferritin: 430 ng/mL — ABNORMAL HIGH (ref 20–380)

## 2015-12-05 LAB — T4, FREE: FREE T4: 0.8 ng/dL (ref 0.8–1.8)

## 2015-12-05 LAB — IRON: IRON: 83 ug/dL (ref 50–180)

## 2015-12-05 LAB — TSH: TSH: 1.04 m[IU]/L (ref 0.40–4.50)

## 2015-12-05 MED ORDER — CITALOPRAM HYDROBROMIDE 20 MG PO TABS: 20.0000 mg | ORAL_TABLET | Freq: Every day | ORAL | 1 refills | Status: DC

## 2015-12-05 MED ORDER — HYDROCHLOROTHIAZIDE 12.5 MG PO TABS: 12.5000 mg | ORAL_TABLET | Freq: Every day | ORAL | 1 refills | Status: DC

## 2015-12-05 MED ORDER — METHOCARBAMOL 500 MG PO TABS: 500.0000 mg | ORAL_TABLET | Freq: Four times a day (QID) | ORAL | 0 refills | Status: AC

## 2015-12-05 MED ORDER — AMLODIPINE BESYLATE 10 MG PO TABS: 10.0000 mg | ORAL_TABLET | Freq: Every day | ORAL | 1 refills | Status: AC

## 2015-12-05 MED ORDER — LISINOPRIL 10 MG PO TABS: 10.0000 mg | ORAL_TABLET | Freq: Every day | ORAL | 1 refills | Status: AC

## 2015-12-05 NOTE — Progress Notes (Signed)
Subjective:  By signing my name below, I, Cesar Parker, attest that this documentation has been prepared under the direction and in the presence of Cesar SimmerKristi Emalynn Clewis, MD.  Electronically Signed: Andrew Auaven Parker, ED Scribe. 12/05/2015. 2:43 PM.   Patient ID: Cesar Parker, male    DOB: 03-May-1970, 45 y.o.   MRN: [id number]  12/05/2015  Medication Refill (needs all meds.)   HPI Cesar Parker is a 45 y.o. male who presents to the [facility] for a follow. He had a DOT physical with [facility] 5/15.   Thyroid  Thyroid US showed multi nodular goiter and 2 nodules that warranted biopsy but he did no go. States his schedules does not fit with their hours. 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   Hypertension He does not check BP outside of office. Takes Norvasc, lisinopril, and HCTZ every day. He reports dizziness about once a month and last a few seconds.  Sleep apnea  Pt wears CPAP every night, and is doing well with this. Can sleep, at most, 18 hours after a long, busy day.  Infertility and Pre mature ejaculation  Pt states celexa works great for him. He has not had trouble with premature ejaculation. He is still with girlfriend.  Very pleased with results.  Anemia  HBG 12.2 03/20/2015. Has never been diagnosed with anemia.   Left back pain Pt complains of left back pain that began 3 days ago. Pt states he pulled a muscle while exercising. He denies radiating pain to lower extremities. He denies saddle anesthesia, numbness, weakness, tingling and burning in LE.  Takes ibuprofen often for generalized pain.   He denies black or bloody stool.  Review of Systems  Constitutional: Negative for activity change, appetite change, chills, diaphoresis, fatigue and fever.  Respiratory: Negative for cough and shortness of breath.   Cardiovascular: Negative for chest pain, palpitations and leg swelling.  Gastrointestinal: Negative for abdominal pain, blood in stool,  diarrhea, nausea and vomiting.  Endocrine: Negative for cold intolerance, heat intolerance, polydipsia, polyphagia and polyuria.  Musculoskeletal: Positive for back pain and myalgias.  Skin: Negative for color change, rash and wound.  Neurological: Positive for dizziness. Negative for tremors, seizures, syncope, facial asymmetry, speech difficulty, weakness, light-headedness, numbness and headaches.  Psychiatric/Behavioral: Negative for dysphoric mood and sleep disturbance. The patient is not nervous/anxious.     Past Medical History:  Diagnosis Date  . Hypertension    onset 2012  . OSA on CPAP    Past Surgical History:  Procedure Laterality Date  . calcaneal spur     Resection. B.  . TESTICLE REMOVAL Right    No Known Allergies  Social History   Social History  . Marital status: Single    Spouse name: N/A  . Number of children: N/A  . Years of education: N/A   Occupational History  . truck driver    Social History Main Topics  . Smoking status: Current Every Day Smoker  . Smokeless tobacco: Not on file  . Alcohol use Not on file  . Drug use: Unknown  . Sexual activity: Yes   Other Topics Concern  . Not on file   Social History Narrative   Marital status: single; dating seriously x 7 years      Children: none; does foster children intermittently      Lives: with girlfriend      Employment: truck driver x 2 years; drives [place]  Tobacco: 1/2 ppd x 20 years      Alcohol:  Socially; rare; last drink 2014      Drugs: none      Exercise: work is physical   Family History  Problem Relation Age of Onset  . Drug abuse Sister   . Mental illness Sister        Objective:    BP 112/71 (BP Location: Right Arm, Patient Position: Sitting, Cuff Size: Large) Comment (BP Location): Rt Forearm  Temp 98.5 F (36.9 C) (Oral)   Resp 20   Ht 5\' 7"  (1.702 m)   Wt (!) 442 lb (200.5 kg)   SpO2 98%   BMI 69.23 kg/m  Physical Exam  Constitutional: He is oriented to  person, place, and time. He appears well-developed and well-nourished. No distress.  HENT:  Head: Normocephalic and atraumatic.  Right Ear: External ear normal.  Left Ear: External ear normal.  Nose: Nose normal.  Mouth/Throat: Oropharynx is clear and moist.  Eyes: Conjunctivae and EOM are normal. Pupils are equal, round, and reactive to light.  Neck: Normal range of motion. Neck supple. Carotid bruit is not present. No thyromegaly present.  Cardiovascular: Normal rate, regular rhythm, normal heart sounds and intact distal pulses.  Exam reveals no gallop and no friction rub.   No murmur heard. Pulmonary/Chest: Effort normal and breath sounds normal. He has no wheezes. He has no rales.  Abdominal: Soft. Bowel sounds are normal. He exhibits no distension and no mass. There is no tenderness. There is no rebound and no guarding.  Musculoskeletal: He exhibits tenderness.  Tenderness to right thoracic para spinal muscle. No mid thoracic muscle tenderness. Pain with flexion, extension  Lymphadenopathy:    He has no cervical adenopathy.  Neurological: He is alert and oriented to person, place, and time. No cranial nerve deficit.  Skin: Skin is warm and dry. No rash noted. He is not diaphoretic.  Psychiatric: He has a normal mood and affect. His behavior is normal.  Nursing note and vitals reviewed.       Assessment & Plan:   1. Essential hypertension   2. Morbid obesity due to excess calories (HCC)   3. OSA on CPAP   4. 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 Thyroid nodule   7. Multinodular goiter   8. Premature ejaculation   9. Anemia, unspecified anemia type   10. Screening for HIV (human immunodeficiency virus)   11. Lumbar strain, initial encounter     Orders Placed This Encounter  Procedures  . Comprehensive metabolic panel  . TSH  . T4, free  . HIV antibody  . Iron  . IBC panel  . Ferritin  . Ambulatory referral to Endocrinology    Referral  Priority:   Routine    Referral Type:   Consultation    Referral Reason:   Specialty Services Required    Number of Visits Requested:   1  . Ambulatory referral to Gastroenterology    Referral Priority:   Routine    Referral Type:   Consultation    Referral Reason:   Specialty Services Required    Number of Visits Requested:   1  . POCT urinalysis dipstick  . POCT Microscopic Urinalysis ([facility])  . POCT CBC  . POC Hemoccult Bld/Stl (3-Cd Home Screen)    Standing Status:   Future    Standing Expiration Date:   12/04/2016   Meds ordered this encounter  Medications  .  lisinopril (PRINIVIL,ZESTRIL) 10 MG tablet    Sig: Take 1 tablet (10 mg total) by mouth daily.    Dispense:  90 tablet    Refill:  1  . hydrochlorothiazide (HYDRODIURIL) 12.5 MG tablet    Sig: Take 1 tablet (12.5 mg total) by mouth daily.    Dispense:  90 tablet    Refill:  1  . citalopram (CELEXA) 20 MG tablet    Sig: Take 1 tablet (20 mg total) by mouth daily.    Dispense:  90 tablet    Refill:  1  . amLODipine (NORVASC) 10 MG tablet    Sig: Take 1 tablet (10 mg total) by mouth daily.    Dispense:  90 tablet    Refill:  1  . methocarbamol (ROBAXIN) 500 MG tablet    Sig: Take 1-2 tablets (500-1,000 mg total) by mouth 4 (four) times daily.    Dispense:  40 tablet    Refill:  0    Return in about 6 months (around 06/06/2016) for recheck high blood pressure, anemia.   I personally performed the services described in this documentation, which was scribed in my presence. The recorded information has been reviewed and considered.  Lee-Anne Flicker Paulita Fujita, M.D. [facility] [address] [place], [place]  [address] [phone] phone [phone] fax

## 2015-12-05 NOTE — Patient Instructions (Addendum)
   IF you received an x-ray today, you will receive an invoice from [facility]. Please contact  Radiology at [phone] with questions or concerns regarding your invoice.   IF you received labwork today, you will receive an invoice from [facility]. Please contact [facility] at [phone] with questions or concerns regarding your invoice.   Our billing staff will not be able to assist you with questions regarding bills from these companies.  You will be contacted with the lab results as soon as they are available. The fastest way to get your results is to activate your My Chart account. Instructions are located on the last page of this paperwork. If you have not heard from us regarding the results in 2 weeks, please contact this office.     Low Back Sprain With Rehab A sprain is an injury in which a ligament is torn. The ligaments of the lower back are vulnerable to sprains. However, they are strong and require great force to be injured. These ligaments are important for stabilizing the spinal column. Sprains are classified into three categories. Grade 1 sprains cause pain, but the tendon is not lengthened. Grade 2 sprains include a lengthened ligament, due to the ligament being stretched or partially ruptured. With grade 2 sprains there is still function, although the function may be decreased. Grade 3 sprains involve a complete tear of the tendon or muscle, and function is usually impaired. SYMPTOMS   Severe pain in the lower back.  Sometimes, a feeling of a "pop," "snap," or tear, at the time of injury.  Tenderness and sometimes swelling at the injury site.  Uncommonly, bruising (contusion) within 48 hours of injury.  Muscle spasms in the back. CAUSES  Low back sprains occur when a force is placed on the ligaments that is greater than they can handle. Common causes of injury include:  Performing a stressful act while  off-balance.  Repetitive stressful activities that involve movement of the lower back.  Direct hit (trauma) to the lower back. RISK INCREASES WITH:  Contact sports (football, wrestling).  Collisions (major skiing accidents).  Sports that require throwing or lifting (baseball, weightlifting).  Sports involving twisting of the spine (gymnastics, diving, tennis, golf).  Poor strength and flexibility.  Inadequate protection.  Previous back injury or surgery (especially fusion). PREVENTION  Wear properly fitted and padded protective equipment.  Warm up and stretch properly before activity.  Allow for adequate recovery between workouts.  Maintain physical fitness:  Strength, flexibility, and endurance.  Cardiovascular fitness.  Maintain a healthy body weight. PROGNOSIS  If treated properly, low back sprains usually heal with non-surgical treatment. The length of time for healing depends on the severity of the injury.  RELATED COMPLICATIONS   Recurring symptoms, resulting in a chronic problem.  Chronic inflammation and pain in the low back.  Delayed healing or resolution of symptoms, especially if activity is resumed too soon.  Prolonged impairment.  Unstable or arthritic joints of the low back. TREATMENT  Treatment first involves the use of ice and medicine, to reduce pain and inflammation. The use of strengthening and stretching exercises may help reduce pain with activity. These exercises may be performed at home or with a therapist. Severe injuries may require referral to a therapist for further evaluation and treatment, such as ultrasound. Your caregiver may advise that you wear a back brace or corset, to help reduce pain and discomfort. Often, prolonged bed rest results in greater harm then benefit. Corticosteroid injections may   be recommended. However, these should be reserved for the most serious cases. It is important to avoid using your back when lifting objects.  At night, sleep on your back on a firm mattress, with a pillow placed under your knees. If non-surgical treatment is unsuccessful, surgery may be needed.  MEDICATION   If pain medicine is needed, nonsteroidal anti-inflammatory medicines (aspirin and ibuprofen), or other minor pain relievers (acetaminophen), are often advised.  Do not take pain medicine for 7 days before surgery.  Prescription pain relievers may be given, if your caregiver thinks they are needed. Use only as directed and only as much as you need.  Ointments applied to the skin may be helpful.  Corticosteroid injections may be given by your caregiver. These injections should be reserved for the most serious cases, because they may only be given a certain number of times. HEAT AND COLD  Cold treatment (icing) should be applied for 10 to 15 minutes every 2 to 3 hours for inflammation and pain, and immediately after activity that aggravates your symptoms. Use ice packs or an ice massage.  Heat treatment may be used before performing stretching and strengthening activities prescribed by your caregiver, physical therapist, or athletic trainer. Use a heat pack or a warm water soak. SEEK MEDICAL CARE IF:   Symptoms get worse or do not improve in 2 to 4 weeks, despite treatment.  You develop numbness or weakness in either leg.  You lose bowel or bladder function.  Any of the following occur after surgery: fever, increased pain, swelling, redness, drainage of fluids, or bleeding in the affected area.  New, unexplained symptoms develop. (Drugs used in treatment may produce side effects.) EXERCISES  RANGE OF MOTION (ROM) AND STRETCHING EXERCISES - Low Back Sprain Most people with lower back pain will find that their symptoms get worse with excessive bending forward (flexion) or arching at the lower back (extension). The exercises that will help resolve your symptoms will focus on the opposite motion.  Your physician, physical  therapist or athletic trainer will help you determine which exercises will be most helpful to resolve your lower back pain. Do not complete any exercises without first consulting with your caregiver. Discontinue any exercises which make your symptoms worse, until you speak to your caregiver. If you have pain, numbness or tingling which travels down into your buttocks, leg or foot, the goal of the therapy is for these symptoms to move closer to your back and eventually resolve. Sometimes, these leg symptoms will get better, but your lower back pain may worsen. This is often an indication of progress in your rehabilitation. Be very alert to any changes in your symptoms and the activities in which you participated in the 24 hours prior to the change. Sharing this information with your caregiver will allow him or her to most efficiently treat your condition. These exercises may help you when beginning to rehabilitate your injury. Your symptoms may resolve with or without further involvement from your physician, physical therapist or athletic trainer. While completing these exercises, remember:   Restoring tissue flexibility helps normal motion to return to the joints. This allows healthier, less painful movement and activity.  An effective stretch should be held for at least 30 seconds.  A stretch should never be painful. You should only feel a gentle lengthening or release in the stretched tissue. FLEXION RANGE OF MOTION AND STRETCHING EXERCISES: STRETCH - Flexion, Single Knee to Chest   Lie on a firm bed or floor with   both legs extended in front of you.  Keeping one leg in contact with the floor, bring your opposite knee to your chest. Hold your leg in place by either grabbing behind your thigh or at your knee.  Pull until you feel a gentle stretch in your low back. Hold __________ seconds.  Slowly release your grasp and repeat the exercise with the opposite side. Repeat __________ times. Complete  this exercise __________ times per day.  STRETCH - Flexion, Double Knee to Chest  Lie on a firm bed or floor with both legs extended in front of you.  Keeping one leg in contact with the floor, bring your opposite knee to your chest.  Tense your stomach muscles to support your back and then lift your other knee to your chest. Hold your legs in place by either grabbing behind your thighs or at your knees.  Pull both knees toward your chest until you feel a gentle stretch in your low back. Hold __________ seconds.  Tense your stomach muscles and slowly return one leg at a time to the floor. Repeat __________ times. Complete this exercise __________ times per day.  STRETCH - Low Trunk Rotation  Lie on a firm bed or floor. Keeping your legs in front of you, bend your knees so they are both pointed toward the ceiling and your feet are flat on the floor.  Extend your arms out to the side. This will stabilize your upper body by keeping your shoulders in contact with the floor.  Gently and slowly drop both knees together to one side until you feel a gentle stretch in your low back. Hold for __________ seconds.  Tense your stomach muscles to support your lower back as you bring your knees back to the starting position. Repeat the exercise to the other side. Repeat __________ times. Complete this exercise __________ times per day  EXTENSION RANGE OF MOTION AND FLEXIBILITY EXERCISES: STRETCH - Extension, Prone on Elbows   Lie on your stomach on the floor, a bed will be too soft. Place your palms about shoulder width apart and at the height of your head.  Place your elbows under your shoulders. If this is too painful, stack pillows under your chest.  Allow your body to relax so that your hips drop lower and make contact more completely with the floor.  Hold this position for __________ seconds.  Slowly return to lying flat on the floor. Repeat __________ times. Complete this exercise  __________ times per day.  RANGE OF MOTION - Extension, Prone Press Ups  Lie on your stomach on the floor, a bed will be too soft. Place your palms about shoulder width apart and at the height of your head.  Keeping your back as relaxed as possible, slowly straighten your elbows while keeping your hips on the floor. You may adjust the placement of your hands to maximize your comfort. As you gain motion, your hands will come more underneath your shoulders.  Hold this position __________ seconds.  Slowly return to lying flat on the floor. Repeat __________ times. Complete this exercise __________ times per day.  RANGE OF MOTION- Quadruped, Neutral Spine   Assume a hands and knees position on a firm surface. Keep your hands under your shoulders and your knees under your hips. You may place padding under your knees for comfort.  Drop your head and point your tailbone toward the ground below you. This will round out your lower back like an angry cat. Hold this position   for __________ seconds.  Slowly lift your head and release your tail bone so that your back sags into a large arch, like an old horse.  Hold this position for __________ seconds.  Repeat this until you feel limber in your low back.  Now, find your "sweet spot." This will be the most comfortable position somewhere between the two previous positions. This is your neutral spine. Once you have found this position, tense your stomach muscles to support your low back.  Hold this position for __________ seconds. Repeat __________ times. Complete this exercise __________ times per day.  STRENGTHENING EXERCISES - Low Back Sprain These exercises may help you when beginning to rehabilitate your injury. These exercises should be done near your "sweet spot." This is the neutral, low-back arch, somewhere between fully rounded and fully arched, that is your least painful position. When performed in this safe range of motion, these exercises  can be used for people who have either a flexion or extension based injury. These exercises may resolve your symptoms with or without further involvement from your physician, physical therapist or athletic trainer. While completing these exercises, remember:   Muscles can gain both the endurance and the strength needed for everyday activities through controlled exercises.  Complete these exercises as instructed by your physician, physical therapist or athletic trainer. Increase the resistance and repetitions only as guided.  You may experience muscle soreness or fatigue, but the pain or discomfort you are trying to eliminate should never worsen during these exercises. If this pain does worsen, stop and make certain you are following the directions exactly. If the pain is still present after adjustments, discontinue the exercise until you can discuss the trouble with your caregiver. STRENGTHENING - Deep Abdominals, Pelvic Tilt   Lie on a firm bed or floor. Keeping your legs in front of you, bend your knees so they are both pointed toward the ceiling and your feet are flat on the floor.  Tense your lower abdominal muscles to press your low back into the floor. This motion will rotate your pelvis so that your tail bone is scooping upwards rather than pointing at your feet or into the floor. With a gentle tension and even breathing, hold this position for __________ seconds. Repeat __________ times. Complete this exercise __________ times per day.  STRENGTHENING - Abdominals, Crunches   Lie on a firm bed or floor. Keeping your legs in front of you, bend your knees so they are both pointed toward the ceiling and your feet are flat on the floor. Cross your arms over your chest.  Slightly tip your chin down without bending your neck.  Tense your abdominals and slowly lift your trunk high enough to just clear your shoulder blades. Lifting higher can put excessive stress on the lower back and does not  further strengthen your abdominal muscles.  Control your return to the starting position. Repeat __________ times. Complete this exercise __________ times per day.  STRENGTHENING - Quadruped, Opposite UE/LE Lift   Assume a hands and knees position on a firm surface. Keep your hands under your shoulders and your knees under your hips. You may place padding under your knees for comfort.  Find your neutral spine and gently tense your abdominal muscles so that you can maintain this position. Your shoulders and hips should form a rectangle that is parallel with the floor and is not twisted.  Keeping your trunk steady, lift your right hand no higher than your shoulder and then your left   leg no higher than your hip. Make sure you are not holding your breath. Hold this position for __________ seconds.  Continuing to keep your abdominal muscles tense and your back steady, slowly return to your starting position. Repeat with the opposite arm and leg. Repeat __________ times. Complete this exercise __________ times per day.  STRENGTHENING - Abdominals and Quadriceps, Straight Leg Raise   Lie on a firm bed or floor with both legs extended in front of you.  Keeping one leg in contact with the floor, bend the other knee so that your foot can rest flat on the floor.  Find your neutral spine, and tense your abdominal muscles to maintain your spinal position throughout the exercise.  Slowly lift your straight leg off the floor about 6 inches for a count of 15, making sure to not hold your breath.  Still keeping your neutral spine, slowly lower your leg all the way to the floor. Repeat this exercise with each leg __________ times. Complete this exercise __________ times per day. POSTURE AND BODY MECHANICS CONSIDERATIONS - Low Back Sprain Keeping correct posture when sitting, standing or completing your activities will reduce the stress put on different body tissues, allowing injured tissues a chance to heal  and limiting painful experiences. The following are general guidelines for improved posture. Your physician or physical therapist will provide you with any instructions specific to your needs. While reading these guidelines, remember:  The exercises prescribed by your provider will help you have the flexibility and strength to maintain correct postures.  The correct posture provides the best environment for your joints to work. All of your joints have less wear and tear when properly supported by a spine with good posture. This means you will experience a healthier, less painful body.  Correct posture must be practiced with all of your activities, especially prolonged sitting and standing. Correct posture is as important when doing repetitive low-stress activities (typing) as it is when doing a single heavy-load activity (lifting). RESTING POSITIONS Consider which positions are most painful for you when choosing a resting position. If you have pain with flexion-based activities (sitting, bending, stooping, squatting), choose a position that allows you to rest in a less flexed posture. You would want to avoid curling into a fetal position on your side. If your pain worsens with extension-based activities (prolonged standing, working overhead), avoid resting in an extended position such as sleeping on your stomach. Most people will find more comfort when they rest with their spine in a more neutral position, neither too rounded nor too arched. Lying on a non-sagging bed on your side with a pillow between your knees, or on your back with a pillow under your knees will often provide some relief. Keep in mind, being in any one position for a prolonged period of time, no matter how correct your posture, can still lead to stiffness. PROPER SITTING POSTURE In order to minimize stress and discomfort on your spine, you must sit with correct posture. Sitting with good posture should be effortless for a healthy body.  Returning to good posture is a gradual process. Many people can work toward this most comfortably by using various supports until they have the flexibility and strength to maintain this posture on their own. When sitting with proper posture, your ears will fall over your shoulders and your shoulders will fall over your hips. You should use the back of the chair to support your upper back. Your lower back will be in a neutral   position, just slightly arched. You may place a small pillow or folded towel at the base of your lower back for  support.  When working at a desk, create an environment that supports good, upright posture. Without extra support, muscles tire, which leads to excessive strain on joints and other tissues. Keep these recommendations in mind: CHAIR:  A chair should be able to slide under your desk when your back makes contact with the back of the chair. This allows you to work closely.  The chair's height should allow your eyes to be level with the upper part of your monitor and your hands to be slightly lower than your elbows. BODY POSITION  Your feet should make contact with the floor. If this is not possible, use a foot rest.  Keep your ears over your shoulders. This will reduce stress on your neck and low back. INCORRECT SITTING POSTURES  If you are feeling tired and unable to assume a healthy sitting posture, do not slouch or slump. This puts excessive strain on your back tissues, causing more damage and pain. Healthier options include:  Using more support, like a lumbar pillow.  Switching tasks to something that requires you to be upright or walking.  Talking a brief walk.  Lying down to rest in a neutral-spine position. PROLONGED STANDING WHILE SLIGHTLY LEANING FORWARD  When completing a task that requires you to lean forward while standing in one place for a long time, place either foot up on a stationary 2-4 inch high object to help maintain the best posture. When  both feet are on the ground, the lower back tends to lose its slight inward curve. If this curve flattens (or becomes too large), then the back and your other joints will experience too much stress, tire more quickly, and can cause pain. CORRECT STANDING POSTURES Proper standing posture should be assumed with all daily activities, even if they only take a few moments, like when brushing your teeth. As in sitting, your ears should fall over your shoulders and your shoulders should fall over your hips. You should keep a slight tension in your abdominal muscles to brace your spine. Your tailbone should point down to the ground, not behind your body, resulting in an over-extended swayback posture.  INCORRECT STANDING POSTURES  Common incorrect standing postures include a forward head, locked knees and/or an excessive swayback. WALKING Walk with an upright posture. Your ears, shoulders and hips should all line-up. PROLONGED ACTIVITY IN A FLEXED POSITION When completing a task that requires you to bend forward at your waist or lean over a low surface, try to find a way to stabilize 3 out of 4 of your limbs. You can place a hand or elbow on your thigh or rest a knee on the surface you are reaching across. This will provide you more stability, so that your muscles do not tire as quickly. By keeping your knees relaxed, or slightly bent, you will also reduce stress across your lower back. CORRECT LIFTING TECHNIQUES DO :  Assume a wide stance. This will provide you more stability and the opportunity to get as close as possible to the object which you are lifting.  Tense your abdominals to brace your spine. Bend at the knees and hips. Keeping your back locked in a neutral-spine position, lift using your leg muscles. Lift with your legs, keeping your back straight.  Test the weight of unknown objects before attempting to lift them.  Try to keep your elbows locked down   at your sides in order get the best  strength from your shoulders when carrying an object.  Always ask for help when lifting heavy or awkward objects. INCORRECT LIFTING TECHNIQUES DO NOT:   Lock your knees when lifting, even if it is a small object.  Bend and twist. Pivot at your feet or move your feet when needing to change directions.  Assume that you can safely pick up even a paperclip without proper posture.   This information is not intended to replace advice given to you by your health care provider. Make sure you discuss any questions you have with your health care provider.   Document Released: 04/11/2005 Document Revised: 05/02/2014 Document Reviewed: 07/24/2008 [employer] Interactive Patient Education 2016 [employer]  

## 2015-12-06 LAB — HIV ANTIBODY (ROUTINE TESTING W REFLEX): HIV 1&2 Ab, 4th Generation: NONREACTIVE

## 2016-01-29 ENCOUNTER — Ambulatory Visit: Payer: Self-pay | Admitting: Internal Medicine

## 2016-03-07 ENCOUNTER — Encounter: Payer: Self-pay | Admitting: Family Medicine

## 2016-03-25 ENCOUNTER — Ambulatory Visit: Payer: Self-pay | Admitting: Internal Medicine

## 2016-03-29 ENCOUNTER — Emergency Department (HOSPITAL_COMMUNITY)
Admission: EM | Admit: 2016-03-29 | Discharge: 2016-03-30 | Disposition: A | Discharge status: Home / Self Care | Payer: 59 | Attending: Emergency Medicine | Admitting: Emergency Medicine

## 2016-03-29 DIAGNOSIS — R05 Cough: Secondary | ICD-10-CM | POA: Diagnosis present

## 2016-03-29 DIAGNOSIS — Z79899 Other long term (current) drug therapy: Secondary | ICD-10-CM | POA: Diagnosis not present

## 2016-03-29 DIAGNOSIS — I10 Essential (primary) hypertension: Secondary | ICD-10-CM | POA: Diagnosis not present

## 2016-03-29 DIAGNOSIS — J069 Acute upper respiratory infection, unspecified: Secondary | ICD-10-CM

## 2016-03-29 DIAGNOSIS — F17200 Nicotine dependence, unspecified, uncomplicated: Secondary | ICD-10-CM | POA: Diagnosis not present

## 2016-03-29 NOTE — ED Triage Notes (Signed)
Pt states that he started having a cough 1 week ago and cannot get rid of it. Thinks he has pulled muscles coughing. Alert and oriented.

## 2016-03-30 ENCOUNTER — Emergency Department (HOSPITAL_COMMUNITY): Payer: 59

## 2016-03-30 ENCOUNTER — Encounter (HOSPITAL_COMMUNITY): Payer: Self-pay

## 2016-03-30 LAB — CBC
HCT: 37.9 % — ABNORMAL LOW (ref 39.0–52.0)
HEMOGLOBIN: 11.7 g/dL — AB (ref 13.0–17.0)
MCH: 25.6 pg — AB (ref 26.0–34.0)
MCHC: 30.9 g/dL (ref 30.0–36.0)
MCV: 82.9 fL (ref 78.0–100.0)
PLATELETS: 240 10*3/uL (ref 150–400)
RBC: 4.57 MIL/uL (ref 4.22–5.81)
RDW: 15.5 % (ref 11.5–15.5)
WBC: 9.7 10*3/uL (ref 4.0–10.5)

## 2016-03-30 LAB — BASIC METABOLIC PANEL
Anion gap: 6 (ref 5–15)
BUN: 15 mg/dL (ref 6–20)
CALCIUM: 8.6 mg/dL — AB (ref 8.9–10.3)
CHLORIDE: 103 mmol/L (ref 101–111)
CO2: 28 mmol/L (ref 22–32)
CREATININE: 0.9 mg/dL (ref 0.61–1.24)
GFR calc Af Amer: >60 mL/min (ref >60)
GFR calc non Af Amer: >60 mL/min (ref >60)
GLUCOSE: 105 mg/dL — AB (ref 65–99)
Potassium: 4 mmol/L (ref 3.5–5.1)
Sodium: 137 mmol/L (ref 135–145)

## 2016-03-30 LAB — I-STAT TROPONIN, ED: TROPONIN I, POC: 0 ng/mL (ref 0.00–0.08)

## 2016-03-30 MED ORDER — SODIUM CHLORIDE 0.9 % IJ SOLN
INTRAMUSCULAR | Status: AC
  Filled 2016-03-30: qty 50

## 2016-03-30 MED ORDER — IOPAMIDOL (ISOVUE-370) INJECTION 76%
INTRAVENOUS | Status: AC
  Filled 2016-03-30: qty 100

## 2016-03-30 MED ORDER — HYDROCODONE-HOMATROPINE 5-1.5 MG/5ML PO SYRP: 5.0000 mL | ORAL_SOLUTION | Freq: Four times a day (QID) | ORAL | 0 refills | Status: AC | PRN

## 2016-03-30 MED ORDER — IOPAMIDOL (ISOVUE-370) INJECTION 76%
100.0000 mL | Freq: Once | INTRAVENOUS | Status: AC | PRN
  Administered 2016-03-30: 100 mL via INTRAVENOUS

## 2016-03-30 NOTE — ED Provider Notes (Signed)
[facility]-EMERGENCY DEPT Provider Note   CSN: [id number] Arrival date & time: 03/29/16  2243 By signing my name below, I, Bridgette Habermann, attest that this documentation has been prepared under the direction and in the presence of Roxy Horseman, PA-C. Electronically Signed: Bridgette Habermann, ED Scribe. 03/30/16. 1:45 AM.  History   Chief Complaint Chief Complaint  Patient presents with  . Cough   HPI Comments: Cesar Parker is a 45 y.o. male with h/o HTN who presents to the Emergency Department complaining of nonproductive cough onset one week ago with associated shortness of breath, sore throat, rhinorrhea, and congestion. He notes he has chest pain and mild abdominal pain secondary to his cough. His symptoms are exacerbated with activity. He has not tried any OTC medications PTA. Pt is a smoker. 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 Denies h/o asthma, CHF, DVT/PE. Pt denies fever, chills, acute leg swelling, or any other associated symptoms.   The history is provided by the patient. No language interpreter was used.    Past Medical History:  Diagnosis Date  . Hypertension    onset 2012  . OSA on CPAP     Patient Active Problem List   Diagnosis Date Noted  . OSA on CPAP 12/05/2015  . 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 Thyroid nodule 12/05/2015  . Multinodular goiter 12/05/2015  . Premature ejaculation 12/05/2015  . HTN (hypertension) 02/01/2014  . Morbid obesity (HCC) 02/01/2014    Past Surgical History:  Procedure Laterality Date  . calcaneal spur     Resection. B.  . TESTICLE REMOVAL Right        Home Medications    Prior to Admission medications   Medication Sig Start Date End Date Taking? Authorizing Provider  amLODipine (NORVASC) 10 MG tablet Take 1 tablet (10 mg total) by mouth daily. 12/05/15   Ethelda Chick, MD  citalopram (CELEXA) 20 MG tablet Take 1 tablet (20 mg total) by mouth daily. 12/05/15   Ethelda Chick, MD    hydrochlorothiazide (HYDRODIURIL) 12.5 MG tablet Take 1 tablet (12.5 mg total) by mouth daily. 12/05/15   Ethelda Chick, MD  ibuprofen (ADVIL,MOTRIN) 200 MG tablet Take 400 mg by mouth every 6 (six) hours as needed for moderate pain.    Historical Provider, MD  lisinopril (PRINIVIL,ZESTRIL) 10 MG tablet Take 1 tablet (10 mg total) by mouth daily. 12/05/15   Ethelda Chick, MD  methocarbamol (ROBAXIN) 500 MG tablet Take 1-2 tablets (500-1,000 mg total) by mouth 4 (four) times daily. 12/05/15   Ethelda Chick, MD  Multiple Vitamin (MULTIVITAMIN WITH MINERALS) TABS tablet Take 1 tablet by mouth daily.    Historical Provider, MD    Family History Family History  Problem Relation Age of Onset  . Drug abuse Sister   . Mental illness Sister     Social History Social History  Substance Use Topics  . Smoking status: Current Every Day Smoker  . Smokeless tobacco: Not on file  . Alcohol use Not on file     Allergies   Patient has no known allergies.   Review of Systems Review of Systems  Constitutional: Negative for chills and fever.  HENT: Positive for congestion, rhinorrhea and sore throat.   Respiratory: Positive for cough and shortness of breath.   Cardiovascular: Positive for chest pain. Negative for leg swelling.  Gastrointestinal: Positive for abdominal pain.  All other systems reviewed and are negative.    Physical Exam Updated  Vital Signs BP 142/78 (BP Location: Left Arm)   Pulse 98   Temp 98.3 F (36.8 C) (Oral)   Resp 18   Ht 5\' 7"  (1.702 m)   Wt (!) 440 lb (199.6 kg)   SpO2 99%   BMI 68.91 kg/m   Physical Exam  Constitutional: He is oriented to person, place, and time. He appears well-developed and well-nourished.  Morbidly obese.  HENT:  Head: Normocephalic and atraumatic.  Eyes: EOM are normal.  Neck: Normal range of motion.  Cardiovascular: Normal rate, regular rhythm, normal heart sounds and intact distal pulses.   Pulmonary/Chest: Effort normal and  breath sounds normal. No respiratory distress. He has no wheezes. He has no rales.  Abdominal: Soft. He exhibits no distension. There is no tenderness.  Musculoskeletal: Normal range of motion. He exhibits edema.  2+ bilaterally pitting edema in LEs.  Neurological: He is alert and oriented to person, place, and time.  Skin: Skin is warm and dry.  Psychiatric: He has a normal mood and affect. Judgment normal.  Nursing note and vitals reviewed.    ED Treatments / Results  DIAGNOSTIC STUDIES: Oxygen Saturation is 99% on RA, normal by my interpretation.    COORDINATION OF CARE: 1:44 AM Discussed treatment plan with pt at bedside which includes lab work and pt agreed to plan.  Labs (all labs ordered are listed, but only abnormal results are displayed) Labs Reviewed  BASIC METABOLIC PANEL - Abnormal; Notable for the following:       Result Value   Glucose, Bld 105 (*)    Calcium 8.6 (*)    All other components within normal limits  CBC - Abnormal; Notable for the following:    Hemoglobin 11.7 (*)    HCT 37.9 (*)    MCH 25.6 (*)    All other components within normal limits  I-STAT TROPOININ, ED    EKG  EKG Interpretation  Date/Time:  Wednesday March 30 2016 00:08:41 EST Ventricular Rate:  67 PR Interval:    QRS Duration: 99 QT Interval:  397 QTC Calculation: 420 R Axis:   65 Text Interpretation:  Sinus rhythm  early transition Confirmed by [facility]hemALUMBO-RASCH  MD, APRIL ([id number]) on 03/30/2016 12:17:34 AM       Radiology No results found.  Procedures Procedures (including critical care time)  Medications Ordered in ED Medications - No data to display   Initial Impression / Assessment and Plan / ED Course  I have reviewed the triage vital signs and the nursing notes.  Pertinent labs & imaging results that were available during my care of the patient were reviewed by me and considered in my medical decision making (see chart for details).  Clinical Course    Patient  seen in fast track, original provider expressed concern over possible PE given SOB and the fact that patient is a truck driver.  CT ordered in FT and patient moved to acute side.  CT negative.   Patients symptoms are consistent with URI, likely viral etiology. Discussed that antibiotics are not indicated for viral infections. Pt will be discharged with symptomatic treatment.  Verbalizes understanding and is agreeable with plan. Pt is hemodynamically stable & in NAD prior to dc.   Final Clinical Impressions(s) / ED Diagnoses   Final diagnoses:  Upper respiratory tract infection, unspecified type    New Prescriptions New Prescriptions   No medications on file   I personally performed the services described in this documentation, which was scribed in my presence.  The recorded information has been reviewed and is accurate.       Roxy HorsemanRobert Romero Letizia, PA-C 03/30/16 0345    Cy BlamerApril Palumbo, MD 03/30/16 [phone]

## 2016-03-30 NOTE — ED Provider Notes (Signed)
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 He developed cold symptoms including sore throat, congestion, nonproductive cough approximate week ago. Now complaining of pain to his left side of his chest as well as his abdomen which he related to persistent coughing. Denies hemoptysis. 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 Given his increased risk of potential PE, plan to obtain CBC, BMP, troponin, EKG, and chest CT angiogram. Patient will be moved to the back for more extensive evaluation by another provider. Care discussed with Dr. Nicanor AlconPalumbo.   Cesar [name], PA-C 03/30/16 0004    [name], MD 03/30/16 0140

## 2016-03-30 NOTE — ED Notes (Signed)
Bed: WA07 Expected date:  Expected time:  Means of arrival:  Comments: 

## 2016-03-30 NOTE — ED Notes (Signed)
Bed: WA06 Expected date:  Expected time:  Means of arrival:  Comments: 

## 2016-08-01 ENCOUNTER — Other Ambulatory Visit: Payer: Self-pay | Admitting: Family Medicine

## 2016-08-01 DIAGNOSIS — I10 Essential (primary) hypertension: Secondary | ICD-10-CM

## 2016-10-05 ENCOUNTER — Other Ambulatory Visit: Payer: Self-pay | Admitting: Family Medicine

## 2016-10-05 DIAGNOSIS — E049 Nontoxic goiter, unspecified: Secondary | ICD-10-CM

## 2017-05-08 ENCOUNTER — Other Ambulatory Visit: Payer: Self-pay | Admitting: Family Medicine

## 2017-05-08 DIAGNOSIS — E049 Nontoxic goiter, unspecified: Secondary | ICD-10-CM

## 2017-09-14 ENCOUNTER — Encounter: Payer: Self-pay | Admitting: Family Medicine

## 2018-04-02 IMAGING — CT CT ANGIO CHEST
3 of 7 series · 18 of 36 positions shown · IV contrast (ISOVUE 370)
Comparison: No recent exams.  Chest radiograph 06/19/2013

CLINICAL DATA: Cough, shortness of breath and chest pain.

EXAM:
CT ANGIOGRAPHY CHEST WITH CONTRAST
TECHNIQUE: Multidetector CT imaging of the chest was performed using the
standard protocol during bolus administration of intravenous
contrast. Multiplanar CT image reconstructions and MIPs were
obtained to evaluate the vascular anatomy.
CONTRAST:  100 cc Isovue 370 IV

[Series 6: thins for pacs · axial · 0.70mm/px · z∈[-248,-9]mm · 15 of 275 slices shown]
[im 18/275  lung]
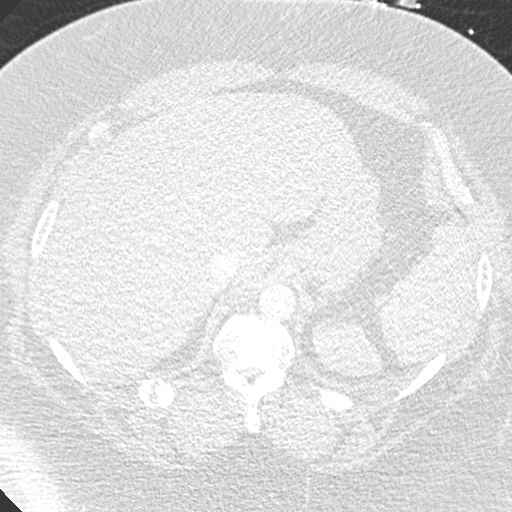
[im 35/275  mediastinal]
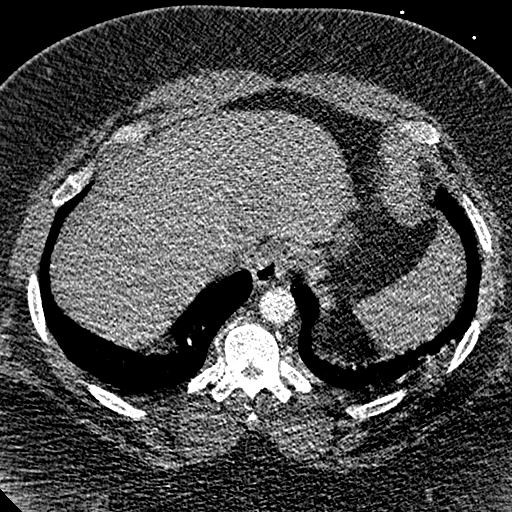
[im 52/275  lung]
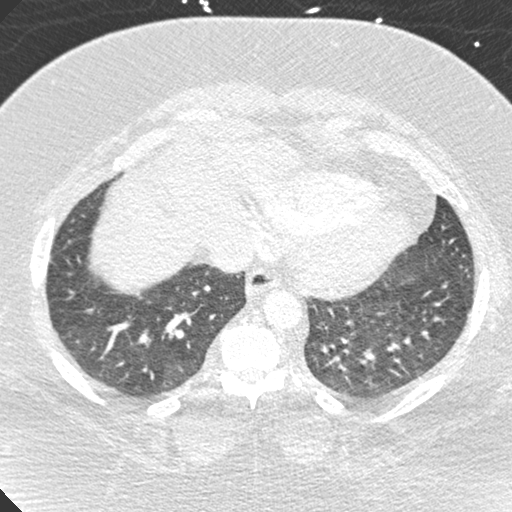
[im 69/275  mediastinal]
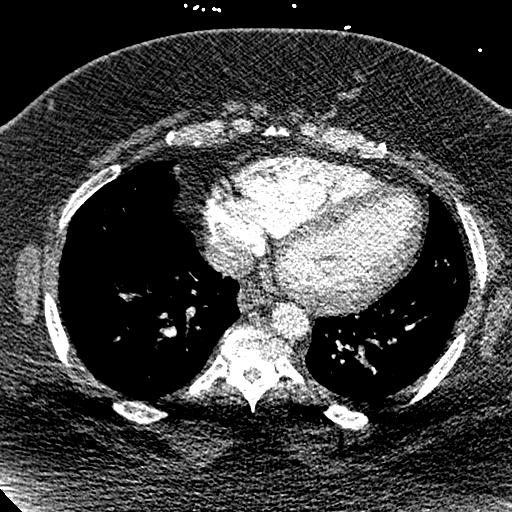
[im 86/275  lung]
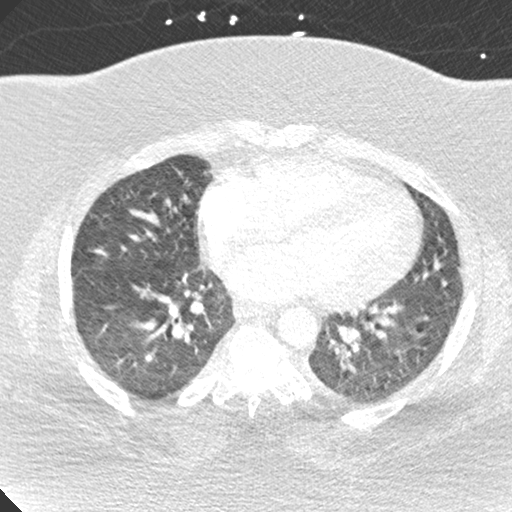
[im 103/275  mediastinal]
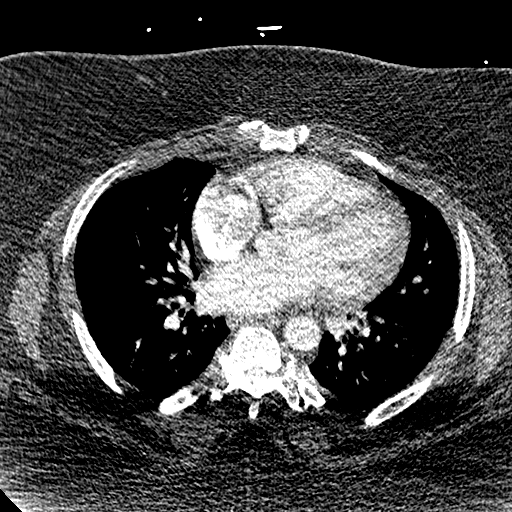
[im 120/275  lung]
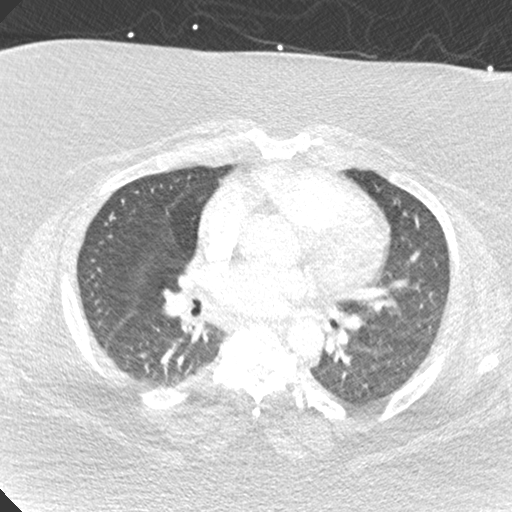
[im 138/275  mediastinal]
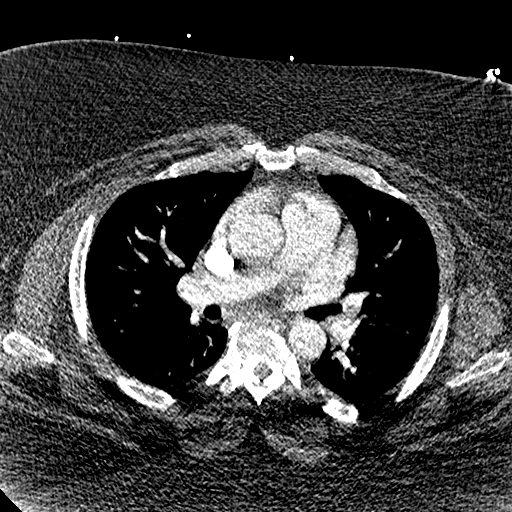
[im 155/275  lung]
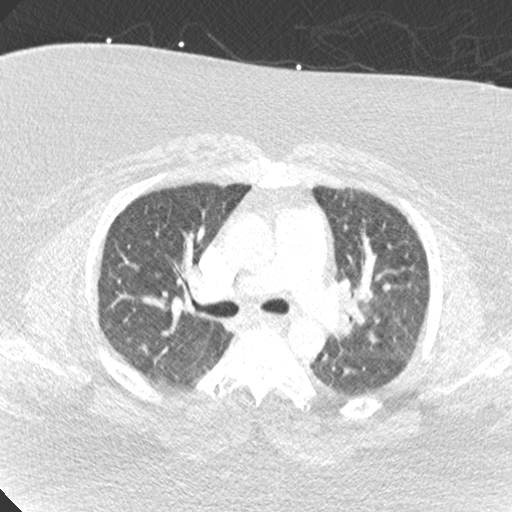
[im 172/275  mediastinal]
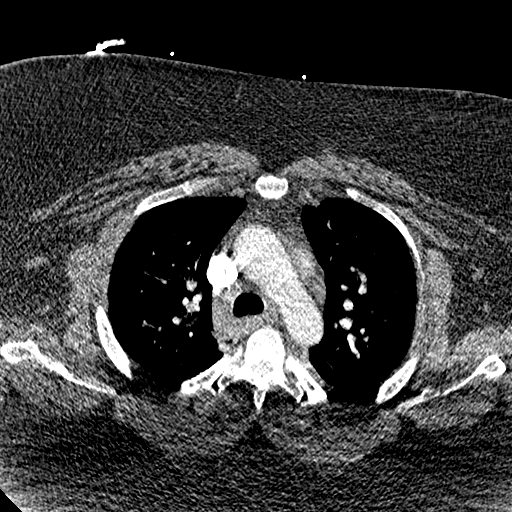
[im 189/275  lung]
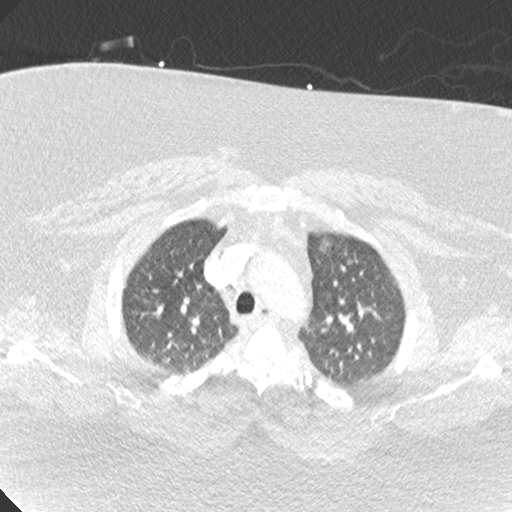
[im 206/275  mediastinal]
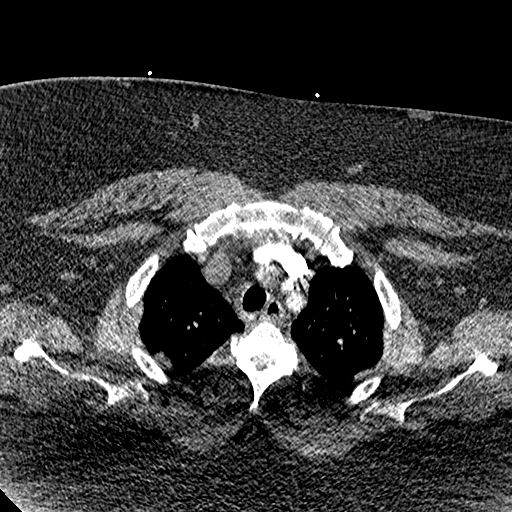
[im 223/275  lung]
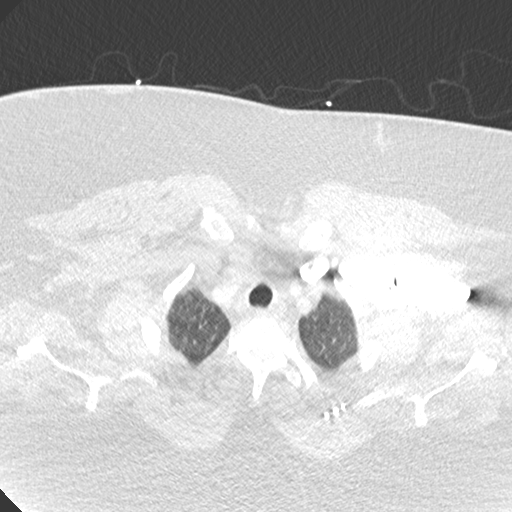
[im 240/275  mediastinal]
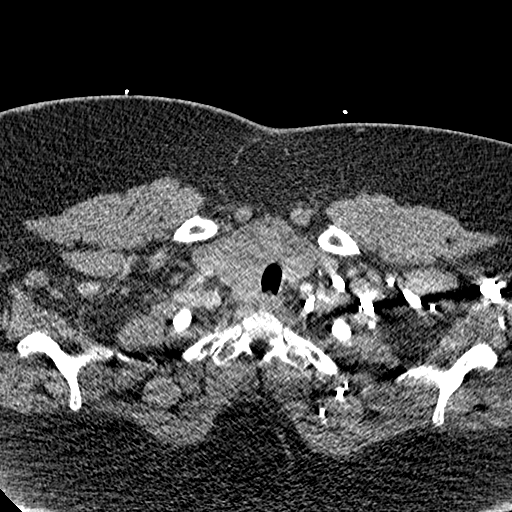
[im 257/275  lung]
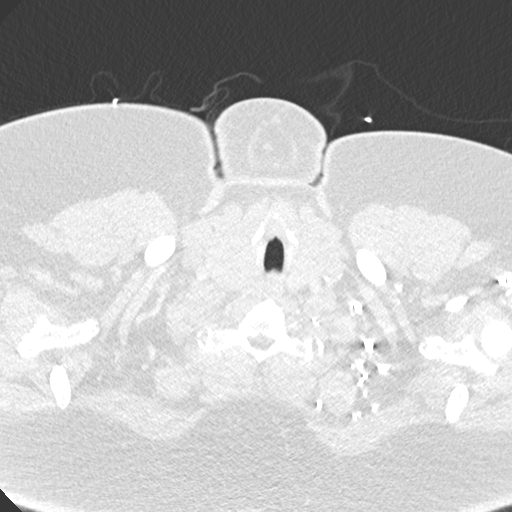

[Series 7: lung windows · axial · 0.70mm/px · z∈[-186,-120]mm · 2 of 87 slices shown]
[im 22/87  mediastinal]
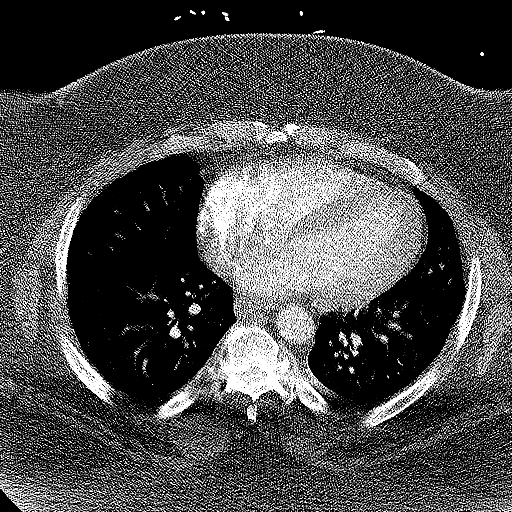
[im 44/87  mediastinal]
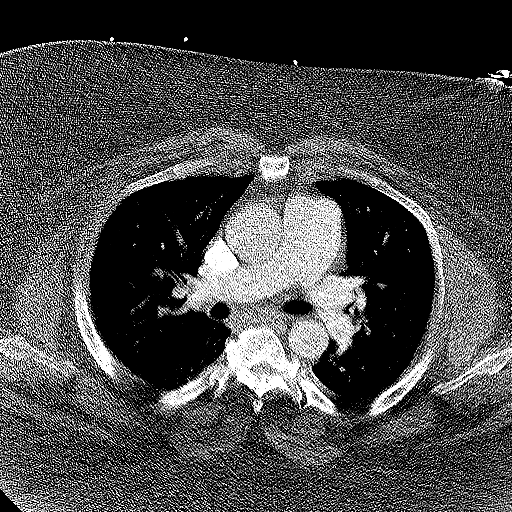

[Series 8: coronal mpr · coronal · 0.53mm/px · 1 of 151 slices shown]
[im 76/151  mediastinal]
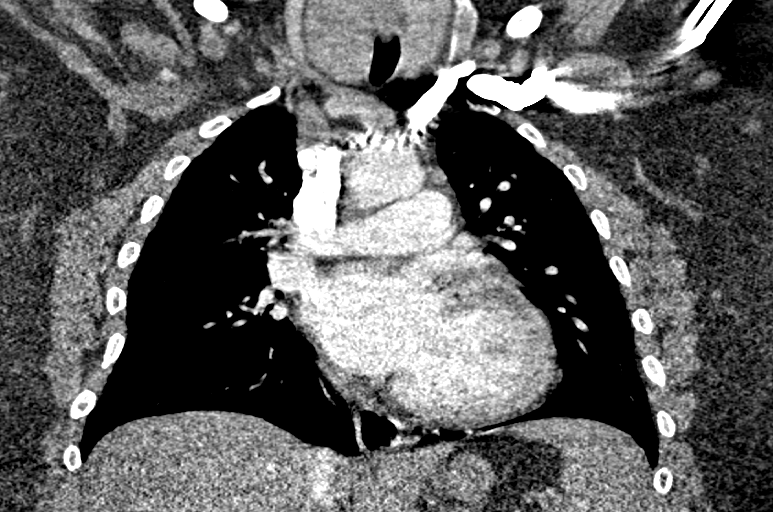

[18 of 36 positions shown; findings below may reference images not displayed]

FINDINGS: Cardiovascular: No central pulmonary embolus to the proximal
segmental level, distal segmental and subsegmental branches cannot
be assessed due to bolus timing and soft tissue attenuation from
body habitus. Thoracic aorta is normal in caliber without
dissection. Mild multi chamber cardiomegaly. Suspect coronary artery
calcifications.

Mediastinum/Nodes: No mediastinal or hilar adenopathy. Diffusely
enlarged thyroid gland, evaluated with ultrasound 04/17/2015.
Patulous distal esophagus.

Lungs/Pleura: Central bronchial thickening, right greater than left.
No focal airspace disease. No evidence of pulmonary edema. No
pleural fluid.

Upper Abdomen: No acute abnormality.  The liver appears enlarged.

Musculoskeletal: Remote posterior right lower rib fracture,
partially included. There are no acute or suspicious osseous
abnormalities.

Review of the MIP images confirms the above findings.
IMPRESSION: 1. No central pulmonary embolus.
2. Bronchial thickening without pneumonia.

## 2023-04-14 ENCOUNTER — Other Ambulatory Visit (HOSPITAL_COMMUNITY): Payer: Self-pay

## 2023-04-14 MED ORDER — HYDROCHLOROTHIAZIDE 25 MG PO TABS
25.0000 mg | ORAL_TABLET | Freq: Every day | ORAL | 3 refills | Status: AC
  Filled 2023-04-14 – 2023-05-05 (×2): qty 90, 90d supply, fill #0
  Filled 2023-07-21: qty 90, 90d supply, fill #1

## 2023-04-14 MED ORDER — OZEMPIC (2 MG/DOSE) 8 MG/3ML ~~LOC~~ SOPN
2.0000 mg | PEN_INJECTOR | SUBCUTANEOUS | 11 refills | Status: DC
  Filled 2023-04-14 – 2023-05-05 (×2): qty 9, 84d supply, fill #0
  Filled 2023-07-21: qty 9, 84d supply, fill #1
  Filled 2024-02-12: qty 3, 28d supply, fill #2
  Filled 2024-04-08: qty 6, 56d supply, fill #3

## 2023-04-14 MED ORDER — POTASSIUM CHLORIDE CRYS ER 10 MEQ PO TBCR
10.0000 meq | EXTENDED_RELEASE_TABLET | Freq: Every day | ORAL | 11 refills | Status: AC
  Filled 2023-04-14 – 2023-05-05 (×2): qty 30, 30d supply, fill #0
  Filled 2023-07-21: qty 30, 30d supply, fill #1

## 2023-04-17 ENCOUNTER — Other Ambulatory Visit (HOSPITAL_COMMUNITY): Payer: Self-pay

## 2023-04-25 ENCOUNTER — Other Ambulatory Visit (HOSPITAL_COMMUNITY): Payer: Self-pay

## 2023-05-05 ENCOUNTER — Other Ambulatory Visit (HOSPITAL_COMMUNITY): Payer: Self-pay

## 2023-05-05 ENCOUNTER — Other Ambulatory Visit: Payer: Self-pay

## 2023-07-21 ENCOUNTER — Other Ambulatory Visit (HOSPITAL_COMMUNITY): Payer: Self-pay

## 2024-01-12 ENCOUNTER — Other Ambulatory Visit (HOSPITAL_COMMUNITY): Payer: Self-pay

## 2024-02-12 ENCOUNTER — Other Ambulatory Visit (HOSPITAL_COMMUNITY): Payer: Self-pay

## 2024-02-21 ENCOUNTER — Other Ambulatory Visit (HOSPITAL_COMMUNITY): Payer: Self-pay

## 2024-02-22 ENCOUNTER — Other Ambulatory Visit (HOSPITAL_COMMUNITY): Payer: Self-pay

## 2024-04-08 ENCOUNTER — Other Ambulatory Visit (HOSPITAL_COMMUNITY): Payer: Self-pay

## 2024-04-17 ENCOUNTER — Other Ambulatory Visit (HOSPITAL_COMMUNITY): Payer: Self-pay

## 2024-04-24 ENCOUNTER — Other Ambulatory Visit: Payer: Self-pay

## 2024-04-24 ENCOUNTER — Other Ambulatory Visit (HOSPITAL_COMMUNITY): Payer: Self-pay

## 2024-04-24 MED ORDER — OZEMPIC (2 MG/DOSE) 8 MG/3ML ~~LOC~~ SOPN
2.0000 mg | PEN_INJECTOR | SUBCUTANEOUS | 11 refills | Status: AC
  Filled 2024-04-24: qty 9, 84d supply, fill #0
